# Patient Record
Sex: Female | Born: 2000 | Hispanic: Yes | Marital: Single | Smoking: Never smoker
Health system: Southern US, Community
[De-identification: ages and names within clinical notes are randomized; demographics above are authoritative.]

## PROBLEM LIST (undated history)

## (undated) DIAGNOSIS — Z789 Other specified health status: Secondary | ICD-10-CM

## (undated) HISTORY — PX: NO PAST SURGERIES: SHX2092

---
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
## 2019-03-13 ENCOUNTER — Other Ambulatory Visit: Payer: Self-pay

## 2019-03-13 DIAGNOSIS — Z3401 Encounter for supervision of normal first pregnancy, first trimester: Secondary | ICD-10-CM

## 2019-03-13 LAB — POCT URINALYSIS DIP (MANUAL ENTRY)
Bilirubin, UA: NEGATIVE
Blood, UA: NEGATIVE
Glucose, UA: NEGATIVE mg/dL
Ketones, POC UA: NEGATIVE mg/dL
Leukocytes, UA: NEGATIVE
Nitrite, UA: NEGATIVE
Protein Ur, POC: NEGATIVE mg/dL
Spec Grav, UA: 1.01 (ref 1.010–1.025)
Urobilinogen, UA: 0.2 E.U./dL
pH, UA: 7 (ref 5.0–8.0)

## 2019-03-15 LAB — OBSTETRIC PANEL, INCLUDING HIV
Antibody Screen: NEGATIVE
Basophils Absolute: 0 10*3/uL (ref 0.0–0.2)
Basos: 0 %
EOS (ABSOLUTE): 0.2 10*3/uL (ref 0.0–0.4)
Eos: 2 %
HIV Screen 4th Generation wRfx: NONREACTIVE
Hematocrit: 37.5 % (ref 34.0–46.6)
Hemoglobin: 13.1 g/dL (ref 11.1–15.9)
Hepatitis B Surface Ag: NEGATIVE
Immature Grans (Abs): 0.1 10*3/uL (ref 0.0–0.1)
Immature Granulocytes: 1 %
Lymphocytes Absolute: 1.7 10*3/uL (ref 0.7–3.1)
Lymphs: 19 %
MCH: 31.3 pg (ref 26.6–33.0)
MCHC: 34.9 g/dL (ref 31.5–35.7)
MCV: 90 fL (ref 79–97)
Monocytes Absolute: 0.7 10*3/uL (ref 0.1–0.9)
Monocytes: 8 %
Neutrophils Absolute: 6.5 10*3/uL (ref 1.4–7.0)
Neutrophils: 70 %
Platelets: 235 10*3/uL (ref 150–450)
RBC: 4.18 x10E6/uL (ref 3.77–5.28)
RDW: 12.8 % (ref 11.7–15.4)
RPR Ser Ql: NONREACTIVE
Rh Factor: POSITIVE
Rubella Antibodies, IGG: 3.89 index (ref >0.99)
WBC: 9.1 10*3/uL (ref 3.4–10.8)

## 2019-03-15 LAB — HGB FRAC. W/SOLUBILITY
Hgb A2 Quant: 2.1 % (ref 1.8–3.2)
Hgb A: 97.9 % (ref 96.4–98.8)
Hgb C: 0 %
Hgb F Quant: 0 % (ref 0.0–2.0)
Hgb S: 0 %
Hgb Solubility: NEGATIVE
Hgb Variant: 0 %

## 2019-03-15 LAB — URINE CULTURE, OB REFLEX

## 2019-03-15 LAB — CULTURE, OB URINE

## 2019-03-20 ENCOUNTER — Telehealth: Payer: Self-pay | Admitting: *Deleted

## 2019-03-20 ENCOUNTER — Ambulatory Visit (INDEPENDENT_AMBULATORY_CARE_PROVIDER_SITE_OTHER): Payer: Self-pay | Admitting: Family Medicine

## 2019-03-20 ENCOUNTER — Other Ambulatory Visit: Payer: Self-pay

## 2019-03-20 ENCOUNTER — Encounter: Payer: Self-pay | Admitting: Family Medicine

## 2019-03-20 ENCOUNTER — Other Ambulatory Visit (HOSPITAL_COMMUNITY)
Admission: RE | Admit: 2019-03-20 | Discharge: 2019-03-20 | Disposition: A | Discharge status: Home / Self Care | Payer: Self-pay | Source: Ambulatory Visit | Attending: Family Medicine | Admitting: Family Medicine

## 2019-03-20 VITALS — BP 106/70 | HR 71 | Temp 97.6°F | Wt 150.0 lb

## 2019-03-20 DIAGNOSIS — Z3401 Encounter for supervision of normal first pregnancy, first trimester: Secondary | ICD-10-CM | POA: Insufficient documentation

## 2019-03-20 MED ORDER — PNV PRENATAL PLUS MULTIVIT+DHA 27-1 & 312 MG PO MISC: 1.0000 | Freq: Every day | ORAL | 0 refills | Status: DC

## 2019-03-20 NOTE — Telephone Encounter (Signed)
Prenatal vit RX is not available. Can we change to prenatal pixie.  The difference is that DHA=200mg  and Iron=10mg .  Will forward to MD.  Jone Baseman, CMA

## 2019-03-20 NOTE — Patient Instructions (Signed)
First Trimester of Pregnancy  The first trimester of pregnancy is from week 1 until the end of week 13 (months 1 through 3). A week after a sperm fertilizes an egg, the egg will implant on the wall of the uterus. This embryo will begin to develop into a baby. Genes from you and your partner will form the baby. The female genes will determine whether the baby will be a boy or a girl. At 6-8 weeks, the eyes and face will be formed, and the heartbeat can be seen on ultrasound. At the end of 12 weeks, all the baby's organs will be formed.  Now that you are pregnant, you will want to do everything you can to have a healthy baby. Two of the most important things are to get good prenatal care and to follow your health care provider's instructions. Prenatal care is all the medical care you receive before the baby's birth. This care will help prevent, find, and treat any problems during the pregnancy and childbirth.  Body changes during your first trimester  Your body goes through many changes during pregnancy. The changes vary from woman to woman.   You may gain or lose a couple of pounds at first.   You may feel sick to your stomach (nauseous) and you may throw up (vomit). If the vomiting is uncontrollable, call your health care provider.   You may tire easily.   You may develop headaches that can be relieved by medicines. All medicines should be approved by your health care provider.   You may urinate more often. Painful urination may mean you have a bladder infection.   You may develop heartburn as a result of your pregnancy.   You may develop constipation because certain hormones are causing the muscles that push stool through your intestines to slow down.   You may develop hemorrhoids or swollen veins (varicose veins).   Your breasts may begin to grow larger and become tender. Your nipples may stick out more, and the tissue that surrounds them (areola) may become darker.   Your gums may bleed and may be  sensitive to brushing and flossing.   Dark spots or blotches (chloasma, mask of pregnancy) may develop on your face. This will likely fade after the baby is born.   Your menstrual periods will stop.   You may have a loss of appetite.   You may develop cravings for certain kinds of food.   You may have changes in your emotions from day to day, such as being excited to be pregnant or being concerned that something may go wrong with the pregnancy and baby.   You may have more vivid and strange dreams.   You may have changes in your hair. These can include thickening of your hair, rapid growth, and changes in texture. Some women also have hair loss during or after pregnancy, or hair that feels dry or thin. Your hair will most likely return to normal after your baby is born.  What to expect at prenatal visits  During a routine prenatal visit:   You will be weighed to make sure you and the baby are growing normally.   Your blood pressure will be taken.   Your abdomen will be measured to track your baby's growth.   The fetal heartbeat will be listened to between weeks 10 and 14 of your pregnancy.   Test results from any previous visits will be discussed.  Your health care provider may ask you:     How you are feeling.   If you are feeling the baby move.   If you have had any abnormal symptoms, such as leaking fluid, bleeding, severe headaches, or abdominal cramping.   If you are using any tobacco products, including cigarettes, chewing tobacco, and electronic cigarettes.   If you have any questions.  Other tests that may be performed during your first trimester include:   Blood tests to find your blood type and to check for the presence of any previous infections. The tests will also be used to check for low iron levels (anemia) and protein on red blood cells (Rh antibodies). Depending on your risk factors, or if you previously had diabetes during pregnancy, you may have tests to check for high blood sugar  that affects pregnant women (gestational diabetes).   Urine tests to check for infections, diabetes, or protein in the urine.   An ultrasound to confirm the proper growth and development of the baby.   Fetal screens for spinal cord problems (spina bifida) and Down syndrome.   HIV (human immunodeficiency virus) testing. Routine prenatal testing includes screening for HIV, unless you choose not to have this test.   You may need other tests to make sure you and the baby are doing well.  Follow these instructions at home:  Medicines   Follow your health care provider's instructions regarding medicine use. Specific medicines may be either safe or unsafe to take during pregnancy.   Take a prenatal vitamin that contains at least 600 micrograms (mcg) of folic acid.   If you develop constipation, try taking a stool softener if your health care provider approves.  Eating and drinking     Eat a balanced diet that includes fresh fruits and vegetables, whole grains, good sources of protein such as meat, eggs, or tofu, and low-fat dairy. Your health care provider will help you determine the amount of weight gain that is right for you.   Avoid raw meat and uncooked cheese. These carry germs that can cause birth defects in the baby.   Eating four or five small meals rather than three large meals a day may help relieve nausea and vomiting. If you start to feel nauseous, eating a few soda crackers can be helpful. Drinking liquids between meals, instead of during meals, also seems to help ease nausea and vomiting.   Limit foods that are high in fat and processed sugars, such as fried and sweet foods.   To prevent constipation:  ? Eat foods that are high in fiber, such as fresh fruits and vegetables, whole grains, and beans.  ? Drink enough fluid to keep your urine clear or pale yellow.  Activity   Exercise only as directed by your health care provider. Most women can continue their usual exercise routine during  pregnancy. Try to exercise for 30 minutes at least 5 days a week. Exercising will help you:  ? Control your weight.  ? Stay in shape.  ? Be prepared for labor and delivery.   Experiencing pain or cramping in the lower abdomen or lower back is a good sign that you should stop exercising. Check with your health care provider before continuing with normal exercises.   Try to avoid standing for long periods of time. Move your legs often if you must stand in one place for a long time.   Avoid heavy lifting.   Wear low-heeled shoes and practice good posture.   You may continue to have sex unless your health care   provider tells you not to.  Relieving pain and discomfort   Wear a good support bra to relieve breast tenderness.   Take warm sitz baths to soothe any pain or discomfort caused by hemorrhoids. Use hemorrhoid cream if your health care provider approves.   Rest with your legs elevated if you have leg cramps or low back pain.   If you develop varicose veins in your legs, wear support hose. Elevate your feet for 15 minutes, 3-4 times a day. Limit salt in your diet.  Prenatal care   Schedule your prenatal visits by the twelfth week of pregnancy. They are usually scheduled monthly at first, then more often in the last 2 months before delivery.   Write down your questions. Take them to your prenatal visits.   Keep all your prenatal visits as told by your health care provider. This is important.  Safety   Wear your seat belt at all times when driving.   Make a list of emergency phone numbers, including numbers for family, friends, the hospital, and police and fire departments.  General instructions   Ask your health care provider for a referral to a local prenatal education class. Begin classes no later than the beginning of month 6 of your pregnancy.   Ask for help if you have counseling or nutritional needs during pregnancy. Your health care provider can offer advice or refer you to specialists for help  with various needs.   Do not use hot tubs, steam rooms, or saunas.   Do not douche or use tampons or scented sanitary pads.   Do not cross your legs for long periods of time.   Avoid cat litter boxes and soil used by cats. These carry germs that can cause birth defects in the baby and possibly loss of the fetus by miscarriage or stillbirth.   Avoid all smoking, herbs, alcohol, and medicines not prescribed by your health care provider. Chemicals in these products affect the formation and growth of the baby.   Do not use any products that contain nicotine or tobacco, such as cigarettes and e-cigarettes. If you need help quitting, ask your health care provider. You may receive counseling support and other resources to help you quit.   Schedule a dentist appointment. At home, brush your teeth with a soft toothbrush and be gentle when you floss.  Contact a health care provider if:   You have dizziness.   You have mild pelvic cramps, pelvic pressure, or nagging pain in the abdominal area.   You have persistent nausea, vomiting, or diarrhea.   You have a bad smelling vaginal discharge.   You have pain when you urinate.   You notice increased swelling in your face, hands, legs, or ankles.   You are exposed to fifth disease or chickenpox.   You are exposed to German measles (rubella) and have never had it.  Get help right away if:   You have a fever.   You are leaking fluid from your vagina.   You have spotting or bleeding from your vagina.   You have severe abdominal cramping or pain.   You have rapid weight gain or loss.   You vomit blood or material that looks like coffee grounds.   You develop a severe headache.   You have shortness of breath.   You have any kind of trauma, such as from a fall or a car accident.  Summary   The first trimester of pregnancy is from week 1 until   the end of week 13 (months 1 through 3).   Your body goes through many changes during pregnancy. The changes vary from  woman to woman.   You will have routine prenatal visits. During those visits, your health care provider will examine you, discuss any test results you may have, and talk with you about how you are feeling.  This information is not intended to replace advice given to you by your health care provider. Make sure you discuss any questions you have with your health care provider.  Document Released: 10/25/2001 Document Revised: 10/12/2016 Document Reviewed: 10/12/2016  Elsevier Interactive Patient Education  2019 [employer]

## 2019-03-20 NOTE — Progress Notes (Signed)
  Subjective:    Donna [name] is a G2P1001  being seen today for her first obstetrical visit.  Her obstetrical history is significant for none. Patient does intend to breast feed. Pregnancy history fully reviewed.  Patient's first child has some ID w/ concern for autism. Pt inquiring about testing for autism for current pregnancy.   Patient wants first trimester genetic screening.   PHQ9 Neg.   Negative IPV screen.   BMI is 27 and < 30. Hispanic. Does not meet criteria for early GTT.   PN labs wnl.   Pt need GC/Chlamydia testing  Patient reports no bleeding, no contractions, no cramping and no leaking.  Vitals:   03/20/19 1100  BP: 106/70  Pulse: 71  Temp: 97.6 F (36.4 C)  Weight: 150 lb (68 kg)    HISTORY: OB History  Gravida Para Term Preterm AB Living  2 1 1     1   SAB TAB Ectopic Multiple Live Births        0 1    # Outcome Date GA Lbr Len/2nd Weight Sex Delivery Anes PTL Lv  2 Current           1 Term 04/06/16 [redacted]w[redacted]d 13:30 / 01:31 7 lb 4.4 oz (3.3 kg) F Vag-Spont EPI  LIV   Past Medical History:  Diagnosis Date  . Medical history non-contributory    Past Surgical History:  Procedure Laterality Date  . NO PAST SURGERIES     No family history on file.   Exam   Gen: NAD, resting comfortably CV: RRR with no murmurs appreciated Pulm: NWOB, CTAB with no crackles, wheezes, or rhonchi GI: Normal bowel sounds present. Soft, Nontender, Nondistended. MSK: no edema, cyanosis, or clubbing noted Skin: warm, dry Neuro: grossly normal, moves all extremities Psych: Normal affect and thought content Today's Vitals   03/20/19 1100  BP: 106/70  Pulse: 71  Temp: 97.6 F (36.4 C)  Weight: 150 lb (68 kg)   There is no height or weight on file to calculate BMI.    Assessment:    Pregnancy: G1P1001 Patient Active Problem List   Diagnosis Date Noted  . NSVD (normal spontaneous vaginal delivery) 04/06/2016  . Active labor at term 04/05/2016         Plan:     Initial labs drawn. Prenatal vitamins. Problem list reviewed and updated. Genetic Screening discussed Quad Screen: ordered.  Ultrasound discussed; fetal survey: ordered.  Follow up in 3 weeks. 80% of 60 min visit spent on counseling and coordination of care.     Donna [name] 03/20/2019

## 2019-03-21 ENCOUNTER — Encounter: Payer: Self-pay | Admitting: Family Medicine

## 2019-03-21 LAB — CERVICOVAGINAL ANCILLARY ONLY
Chlamydia: NEGATIVE
Neisseria Gonorrhea: NEGATIVE

## 2019-03-22 ENCOUNTER — Encounter: Payer: Self-pay | Admitting: Family Medicine

## 2019-03-27 ENCOUNTER — Encounter: Payer: Self-pay | Admitting: Licensed Clinical Social Worker

## 2019-03-27 NOTE — Progress Notes (Signed)
Social work referral placed in error by provider.  Patient is with [facility] [facility] [employer].   No social work services needs at this time.   Sammuel Hines, LCSW [facility]   [phone] 1:59 PM

## 2019-03-28 ENCOUNTER — Other Ambulatory Visit: Payer: Self-pay | Admitting: Family Medicine

## 2019-03-28 DIAGNOSIS — Z3401 Encounter for supervision of normal first pregnancy, first trimester: Secondary | ICD-10-CM

## 2019-04-01 ENCOUNTER — Telehealth: Payer: Self-pay

## 2019-04-01 ENCOUNTER — Encounter (HOSPITAL_COMMUNITY): Payer: Self-pay

## 2019-04-01 ENCOUNTER — Other Ambulatory Visit: Payer: Self-pay

## 2019-04-01 ENCOUNTER — Ambulatory Visit (HOSPITAL_COMMUNITY)
Admission: RE | Admit: 2019-04-01 | Discharge: 2019-04-01 | Disposition: A | Discharge status: Home / Self Care | Payer: Self-pay | Source: Ambulatory Visit | Attending: Family Medicine | Admitting: Family Medicine

## 2019-04-01 DIAGNOSIS — Z3401 Encounter for supervision of normal first pregnancy, first trimester: Secondary | ICD-10-CM

## 2019-04-01 NOTE — Telephone Encounter (Signed)
Eber Jones, with MFM, called nurse line stating the patient came in this morning for her dating [place]. Eber Jones stated the patient informed them she just had an US done with the health department, as a part of the adopt a mom program. Eber Jones called to let us know that she was going to let the patient go with being scanned, as patient just had [place], and it would cost her 500 today to have another one done. You can call Eber Jones with any questions regarding future [place].   Eber Jones [phone]

## 2019-04-03 ENCOUNTER — Encounter: Payer: Self-pay | Admitting: Family Medicine

## 2019-04-03 DIAGNOSIS — Z3490 Encounter for supervision of normal pregnancy, unspecified, unspecified trimester: Secondary | ICD-10-CM | POA: Insufficient documentation

## 2019-04-12 ENCOUNTER — Other Ambulatory Visit: Payer: Self-pay

## 2019-04-12 ENCOUNTER — Ambulatory Visit (INDEPENDENT_AMBULATORY_CARE_PROVIDER_SITE_OTHER): Payer: Self-pay | Admitting: Family Medicine

## 2019-04-12 DIAGNOSIS — Z3401 Encounter for supervision of normal first pregnancy, first trimester: Secondary | ICD-10-CM

## 2019-04-12 MED ORDER — PNV PRENATAL PLUS MULTIVIT+DHA 27-1 & 312 MG PO MISC: 1.0000 | Freq: Every day | ORAL | 3 refills | Status: DC

## 2019-04-12 NOTE — Progress Notes (Signed)
Donna [name] is a 18 y.o. G2P1001 at 108w0d here for routine follow up.  She reports nausea, no bleeding, no contractions, no cramping and no leaking. See flow sheet for details.  She recently had a dating ultrasound on 5/11 that showed an estimated due date of 09/26/2019.  This corresponds with her LMP indicating an estimated due date of 09/27/2019.  She would like to breast-feed and get the copper IUD after pregnancy.  If the baby is a boy, she is interested in a circumcision.  Height: 58.5" Pregravid BMI: 29.3 FHT: 142 bpm Fundal height: 16 cm GU: Normal vulva, vagina, and cervix.  No abnormal discharge.  Uterus is appropriate size for dates.  A/P: Pregnancy at [redacted]w[redacted]d.  Doing well.   Pregnancy issues include young age, no insurance.  Part of adopt-a-mom program. Anatomy ultrasound ordered to be scheduled at 18-19 weeks.  Patient will get this done at the health department. Genetic/infection screening was performed today and was updated in patient's chart. Quad screen was performed today. GC/Chlamydia was performed at her last visit and was negative.  Patient was informed today. Bleeding and pain precautions reviewed. [facility]

## 2019-04-12 NOTE — Patient Instructions (Addendum)
It was nice to meet you today, Donna [name]!  Today, we are scheduling you for your anatomy ultrasound, which will be done in about 2 weeks.  I also sent in  Second Trimester of Pregnancy The second trimester is from week 14 through week 27 (months 4 through 6). The second trimester is often a time when you feel your best. Your body has adjusted to being pregnant, and you begin to feel better physically. Usually, morning sickness has lessened or quit completely, you may have more energy, and you may have an increase in appetite. The second trimester is also a time when the fetus is growing rapidly. At the end of the sixth month, the fetus is about 9 inches long and weighs about 1 pounds. You will likely begin to feel the baby move (quickening) between 16 and 20 weeks of pregnancy. Body changes during your second trimester Your body continues to go through many changes during your second trimester. The changes vary from woman to woman.  Your weight will continue to increase. You will notice your lower abdomen bulging out.  You may begin to get stretch marks on your hips, abdomen, and breasts.  You may develop headaches that can be relieved by medicines. The medicines should be approved by your health care provider.  You may urinate more often because the fetus is pressing on your bladder.  You may develop or continue to have heartburn as a result of your pregnancy.  You may develop constipation because certain hormones are causing the muscles that push waste through your intestines to slow down.  You may develop hemorrhoids or swollen, bulging veins (varicose veins).  You may have back pain. This is caused by: ? Weight gain. ? Pregnancy hormones that are relaxing the joints in your pelvis. ? A shift in weight and the muscles that support your balance.  Your breasts will continue to grow and they will continue to become tender.  Your gums may bleed and may be sensitive to brushing  and flossing.  Dark spots or blotches (chloasma, mask of pregnancy) may develop on your face. This will likely fade after the baby is born.  A dark line from your belly button to the pubic area (linea nigra) may appear. This will likely fade after the baby is born.  You may have changes in your hair. These can include thickening of your hair, rapid growth, and changes in texture. Some women also have hair loss during or after pregnancy, or hair that feels dry or thin. Your hair will most likely return to normal after your baby is born. What to expect at prenatal visits During a routine prenatal visit:  You will be weighed to make sure you and the fetus are growing normally.  Your blood pressure will be taken.  Your abdomen will be measured to track your baby's growth.  The fetal heartbeat will be listened to.  Any test results from the previous visit will be discussed. Your health care provider may ask you:  How you are feeling.  If you are feeling the baby move.  If you have had any abnormal symptoms, such as leaking fluid, bleeding, severe headaches, or abdominal cramping.  If you are using any tobacco products, including cigarettes, chewing tobacco, and electronic cigarettes.  If you have any questions. Other tests that may be performed during your second trimester include:  Blood tests that check for: ? Low iron levels (anemia). ? High blood sugar that affects pregnant women (gestational diabetes)  between 24 and 28 weeks. ? Rh antibodies. This is to check for a protein on red blood cells (Rh factor).  Urine tests to check for infections, diabetes, or protein in the urine.  An ultrasound to confirm the proper growth and development of the baby.  An amniocentesis to check for possible genetic problems.  Fetal screens for spina bifida and Down syndrome.  HIV (human immunodeficiency virus) testing. Routine prenatal testing includes screening for HIV, unless you choose not  to have this test. Follow these instructions at home: Medicines  Follow your health care provider's instructions regarding medicine use. Specific medicines may be either safe or unsafe to take during pregnancy.  Take a prenatal vitamin that contains at least 600 micrograms (mcg) of folic acid.  If you develop constipation, try taking a stool softener if your health care provider approves. Eating and drinking   Eat a balanced diet that includes fresh fruits and vegetables, whole grains, good sources of protein such as meat, eggs, or tofu, and low-fat dairy. Your health care provider will help you determine the amount of weight gain that is right for you.  Avoid raw meat and uncooked cheese. These carry germs that can cause birth defects in the baby.  If you have low calcium intake from food, talk to your health care provider about whether you should take a daily calcium supplement.  Limit foods that are high in fat and processed sugars, such as fried and sweet foods.  To prevent constipation: ? Drink enough fluid to keep your urine clear or pale yellow. ? Eat foods that are high in fiber, such as fresh fruits and vegetables, whole grains, and beans. Activity  Exercise only as directed by your health care provider. Most women can continue their usual exercise routine during pregnancy. Try to exercise for 30 minutes at least 5 days a week. Stop exercising if you experience uterine contractions.  Avoid heavy lifting, wear low heel shoes, and practice good posture.  A sexual relationship may be continued unless your health care provider directs you otherwise. Relieving pain and discomfort  Wear a good support bra to prevent discomfort from breast tenderness.  Take warm sitz baths to soothe any pain or discomfort caused by hemorrhoids. Use hemorrhoid cream if your health care provider approves.  Rest with your legs elevated if you have leg cramps or low back pain.  If you develop  varicose veins, wear support hose. Elevate your feet for 15 minutes, 3-4 times a day. Limit salt in your diet. Prenatal Care  Write down your questions. Take them to your prenatal visits.  Keep all your prenatal visits as told by your health care provider. This is important. Safety  Wear your seat belt at all times when driving.  Make a list of emergency phone numbers, including numbers for family, friends, the hospital, and police and fire departments. General instructions  Ask your health care provider for a referral to a local prenatal education class. Begin classes no later than the beginning of month 6 of your pregnancy.  Ask for help if you have counseling or nutritional needs during pregnancy. Your health care provider can offer advice or refer you to specialists for help with various needs.  Do not use hot tubs, steam rooms, or saunas.  Do not douche or use tampons or scented sanitary pads.  Do not cross your legs for long periods of time.  Avoid cat litter boxes and soil used by cats. These carry germs that can  cause birth defects in the baby and possibly loss of the fetus by miscarriage or stillbirth.  Avoid all smoking, herbs, alcohol, and unprescribed drugs. Chemicals in these products can affect the formation and growth of the baby.  Do not use any products that contain nicotine or tobacco, such as cigarettes and e-cigarettes. If you need help quitting, ask your health care provider.  Visit your dentist if you have not gone yet during your pregnancy. Use a soft toothbrush to brush your teeth and be gentle when you floss. Contact a health care provider if:  You have dizziness.  You have mild pelvic cramps, pelvic pressure, or nagging pain in the abdominal area.  You have persistent nausea, vomiting, or diarrhea.  You have a bad smelling vaginal discharge.  You have pain when you urinate. Get help right away if:  You have a fever.  You are leaking fluid from  your vagina.  You have spotting or bleeding from your vagina.  You have severe abdominal cramping or pain.  You have rapid weight gain or weight loss.  You have shortness of breath with chest pain.  You notice sudden or extreme swelling of your face, hands, ankles, feet, or legs.  You have not felt your baby move in over an hour.  You have severe headaches that do not go away when you take medicine.  You have vision changes. Summary  The second trimester is from week 14 through week 27 (months 4 through 6). It is also a time when the fetus is growing rapidly.  Your body goes through many changes during pregnancy. The changes vary from woman to woman.  Avoid all smoking, herbs, alcohol, and unprescribed drugs. These chemicals affect the formation and growth your baby.  Do not use any tobacco products, such as cigarettes, chewing tobacco, and e-cigarettes. If you need help quitting, ask your health care provider.  Contact your health care provider if you have any questions. Keep all prenatal visits as told by your health care provider. This is important. This information is not intended to replace advice given to you by your health care provider. Make sure you discuss any questions you have with your health care provider. Document Released: 10/25/2001 Document Revised: 12/06/2016 Document Reviewed: 12/06/2016 Elsevier Interactive Patient Education  2019 [employer]

## 2019-04-15 ENCOUNTER — Other Ambulatory Visit: Payer: Self-pay

## 2019-04-23 ENCOUNTER — Encounter: Payer: Self-pay | Admitting: Family Medicine

## 2019-04-23 ENCOUNTER — Telehealth: Payer: Self-pay

## 2019-04-23 NOTE — Telephone Encounter (Signed)
Pharmacist calls nurse line stating the recent prenatals that were sent in are not in their system. Pharmacy stated they are OTC now and can not be prescribed anymore. The pharmacist suggested to patient to purchase OTC, however patient wants a prescription. Pharmacy is requesting an alternative to be sent in. When I asked what he suggested, "there are 1 million to choose from." Please advise.

## 2019-04-29 ENCOUNTER — Other Ambulatory Visit: Payer: Self-pay | Admitting: Family Medicine

## 2019-04-29 MED ORDER — PRENATAL VITAMINS 28-0.8 MG PO TABS: 1.0000 | ORAL_TABLET | Freq: Every day | ORAL | 6 refills | Status: AC

## 2019-04-29 NOTE — Telephone Encounter (Signed)
I have sent another prescription for prenatal vitamins.  If these are not in the system, I will be happy to speak to a pharmacist or tech about what they suggest instead.

## 2019-05-01 ENCOUNTER — Encounter: Payer: Self-pay | Admitting: Family Medicine

## 2019-05-13 ENCOUNTER — Ambulatory Visit (INDEPENDENT_AMBULATORY_CARE_PROVIDER_SITE_OTHER): Payer: Self-pay | Admitting: Student in an Organized Health Care Education/Training Program

## 2019-05-13 ENCOUNTER — Other Ambulatory Visit: Payer: Self-pay

## 2019-05-13 VITALS — BP 96/60 | HR 88 | Temp 98.6°F | Wt 155.6 lb

## 2019-05-13 DIAGNOSIS — Z3482 Encounter for supervision of other normal pregnancy, second trimester: Secondary | ICD-10-CM

## 2019-05-13 NOTE — Patient Instructions (Signed)
Please follow up in 4 weeks in [facility].

## 2019-05-13 NOTE — Progress Notes (Signed)
Donna [name] is a 18 y.o. G2P1001 at [redacted]w[redacted]d here for routine follow up.  She reports +FM, no bleeding, no contractions, no LOF, no pain, no nausea. She reports +normal white vaginal discharge with no pruritis, dysuria, or pain.  See flow sheet for details.  A/P: Pregnancy at [redacted]w[redacted]d.  Doing well.   Pregnancy issues include young age, no insurance. Adopt a mom program.   1. Anatomy scan has been completed, not yet available in epic, will need to be reviewed at next visit.  2. Genetic/infection screening from last visit was reviewed. GC/chla negative. Quad screen negative.  3. Normal prenatal care Fetal heart tones difficult to find with doppler. U/S brought into the room. Heart beat clearly visualized. We were able to find FHT with doppler using U/S visualization. Preterm labor precautions reviewed. Follow up 4 weeks.

## 2019-05-15 ENCOUNTER — Other Ambulatory Visit: Payer: Self-pay | Admitting: Family Medicine

## 2019-05-15 DIAGNOSIS — O321XX0 Maternal care for breech presentation, not applicable or unspecified: Secondary | ICD-10-CM

## 2019-05-15 NOTE — Progress Notes (Signed)
Breech presentation on U/S. Referral to OB/GYN for further management.

## 2019-05-16 NOTE — Addendum Note (Signed)
Addended by: Leeanne Rio on: 05/16/2019 04:01 PM   Modules accepted: Orders

## 2019-05-16 NOTE — Progress Notes (Signed)
Reviewed chart as preceptor for recent prenatal visit. Referral to OBGYN for breech presentation at 13 weeks is not indicated. Canceled referral. Leeanne Rio, MD

## 2019-06-13 ENCOUNTER — Telehealth: Payer: Self-pay | Admitting: Family Medicine

## 2019-06-13 ENCOUNTER — Ambulatory Visit (INDEPENDENT_AMBULATORY_CARE_PROVIDER_SITE_OTHER): Payer: Self-pay | Admitting: Family Medicine

## 2019-06-13 ENCOUNTER — Other Ambulatory Visit: Payer: Self-pay

## 2019-06-13 VITALS — BP 102/62 | HR 98 | Temp 97.6°F | Wt 161.0 lb

## 2019-06-13 DIAGNOSIS — Z3482 Encounter for supervision of other normal pregnancy, second trimester: Secondary | ICD-10-CM

## 2019-06-13 LAB — POCT 1 HR PRENATAL GLUCOSE: Glucose 1 Hr Prenatal, POC: 116 mg/dL

## 2019-06-13 NOTE — Assessment & Plan Note (Signed)
Normal, low risk pregnancy - Will follow up with [facility] to obtain anatomy US - 1 hr glucola today at 116 - Will refer pt to Dr. Kennon Rounds to discuss tubal ligation vs postplacental IUD - 4 wk follow up at 28 weeks for labs

## 2019-06-13 NOTE — Telephone Encounter (Signed)
Called patient to discuss results of ultrasound. Low lying placenta was noted. Everything else was within normal limits. Patient appreciated discussion and acknowledged understanding.

## 2019-06-13 NOTE — Progress Notes (Addendum)
    Subjective:    Patient ID: Donna [name], female    DOB: Nov 23, 2000, 18 y.o.   MRN: [id number]   CC: second trimester of pregnancy screening  HPI: Donna [name] is a healthy 18 yo G2P1001 woman at [redacted]w[redacted]d presenting today for routine prenatal screening.  +FM, No VB, LOF, CTX.  Donna [name] asked about tubal ligation. She wanted to get an IUD after her last child, but she was told to wait 40 days and then have it inserted with her period, but her period did not return for over a year. Her older daughter is likely on the autism spectrum disorder and she wants to focus on caring for these two children only. Because of her age and she does not have insurance, other birth control methods were discussed, and Donna [name] is interested in a postplacental IUD as a back up plan.  Donna [name] plans to breast feed, she successfully bf her daughter for over a year. She is currently noticing small, clear leakage from her breasts. No redness, swelling, heat, fever, or chills.  She plans to have a vaginal delivery with an epidural, which was her first delivery course as well. No problems with previous birth.  Donna [name] had an anatomy [place] through [employer] on 04/29/2019.   Smoking status reviewed   ROS: pertinent noted in the HPI    Past medical history, surgical, family, and social history reviewed and updated in the EMR as appropriate.  Objective:  BP 102/62   Pulse 98   Temp 97.6 F (36.4 C)   Wt 161 lb (73 kg)   LMP 12/21/2018 (Exact Date)   Vitals and nursing note reviewed  General: NAD, pleasant, able to participate in exam Cardiac: RRR, S1 S2 present. normal heart sounds, no murmurs. Respiratory: CTAB, normal effort, No wheezes, rales or rhonchi Extremities: no edema or cyanosis. Skin: warm and dry, no rashes noted Neuro: alert, no obvious focal deficits Psych: Normal affect and mood  Doppler tones: 130 Fundal height: 25 cm  Assessment & Plan:   Donna [name] is a healthy 18 yo  G2P1001 woman at [redacted]w[redacted]d.  Supervision of normal pregnancy Normal, low risk pregnancy - Will follow up with [facility] to obtain anatomy US - 1 hr glucola today at 116 - Will refer Donna [name] to Dr. Kennon Rounds to discuss tubal ligation vs postplacental IUD - 4 wk follow up at 28 weeks for labs  Gladys Damme, MD [place] PGY-1

## 2019-06-13 NOTE — Patient Instructions (Signed)
It was nice to meet you today!  If you have any cramping/contractions, vaginal bleeding, fluid leaking, or are worried that baby is not moving normally, go immediately to the Maternity Admissions Unit to be evaluated.   Next visit with Dr. Kennon Rounds in 4 weeks to talk about possible getting tubes tied  Be well, Dr. Ardelia Mems    Second Trimester of Pregnancy  The second trimester is from week 14 through week 27 (month 4 through 6). This is often the time in pregnancy that you feel your best. Often times, morning sickness has lessened or quit. You may have more energy, and you may get hungry more often. Your unborn baby is growing rapidly. At the end of the sixth month, he or she is about 9 inches long and weighs about 1 pounds. You will likely feel the baby move between 18 and 20 weeks of pregnancy. Follow these instructions at home: Medicines  Take over-the-counter and prescription medicines only as told by your doctor. Some medicines are safe and some medicines are not safe during pregnancy.  Take a prenatal vitamin that contains at least 600 micrograms (mcg) of folic acid.  If you have trouble pooping (constipation), take medicine that will make your stool soft (stool softener) if your doctor approves. Eating and drinking   Eat regular, healthy meals.  Avoid raw meat and uncooked cheese.  If you get low calcium from the food you eat, talk to your doctor about taking a daily calcium supplement.  Avoid foods that are high in fat and sugars, such as fried and sweet foods.  If you feel sick to your stomach (nauseous) or throw up (vomit): ? Eat 4 or 5 small meals a day instead of 3 large meals. ? Try eating a few soda crackers. ? Drink liquids between meals instead of during meals.  To prevent constipation: ? Eat foods that are high in fiber, like fresh fruits and vegetables, whole grains, and beans. ? Drink enough fluids to keep your pee (urine) clear or pale yellow. Activity   Exercise only as told by your doctor. Stop exercising if you start to have cramps.  Do not exercise if it is too hot, too humid, or if you are in a place of great height (high altitude).  Avoid heavy lifting.  Wear low-heeled shoes. Sit and stand up straight.  You can continue to have sex unless your doctor tells you not to. Relieving pain and discomfort  Wear a good support bra if your breasts are tender.  Take warm water baths (sitz baths) to soothe pain or discomfort caused by hemorrhoids. Use hemorrhoid cream if your doctor approves.  Rest with your legs raised if you have leg cramps or low back pain.  If you develop puffy, bulging veins (varicose veins) in your legs: ? Wear support hose or compression stockings as told by your doctor. ? Raise (elevate) your feet for 15 minutes, 3-4 times a day. ? Limit salt in your food. Prenatal care  Write down your questions. Take them to your prenatal visits.  Keep all your prenatal visits as told by your doctor. This is important. Safety  Wear your seat belt when driving.  Make a list of emergency phone numbers, including numbers for family, friends, the hospital, and police and fire departments. General instructions  Ask your doctor about the right foods to eat or for help finding a counselor, if you need these services.  Ask your doctor about local prenatal classes. Begin classes before month  6 of your pregnancy.  Do not use hot tubs, steam rooms, or saunas.  Do not douche or use tampons or scented sanitary pads.  Do not cross your legs for long periods of time.  Visit your dentist if you have not done so. Use a soft toothbrush to brush your teeth. Floss gently.  Avoid all smoking, herbs, and alcohol. Avoid drugs that are not approved by your doctor.  Do not use any products that contain nicotine or tobacco, such as cigarettes and e-cigarettes. If you need help quitting, ask your doctor.  Avoid cat litter boxes and soil  used by cats. These carry germs that can cause birth defects in the baby and can cause a loss of your baby (miscarriage) or stillbirth. Contact a doctor if:  You have mild cramps or pressure in your lower belly.  You have pain when you pee (urinate).  You have bad smelling fluid coming from your vagina.  You continue to feel sick to your stomach (nauseous), throw up (vomit), or have watery poop (diarrhea).  You have a nagging pain in your belly area.  You feel dizzy. Get help right away if:  You have a fever.  You are leaking fluid from your vagina.  You have spotting or bleeding from your vagina.  You have severe belly cramping or pain.  You lose or gain weight rapidly.  You have trouble catching your breath and have chest pain.  You notice sudden or extreme puffiness (swelling) of your face, hands, ankles, feet, or legs.  You have not felt the baby move in over an hour.  You have severe headaches that do not go away when you take medicine.  You have trouble seeing. Summary  The second trimester is from week 14 through week 27 (months 4 through 6). This is often the time in pregnancy that you feel your best.  To take care of yourself and your unborn baby, you will need to eat healthy meals, take medicines only if your doctor tells you to do so, and do activities that are safe for you and your baby.  Call your doctor if you get sick or if you notice anything unusual about your pregnancy. Also, call your doctor if you need help with the right food to eat, or if you want to know what activities are safe for you. This information is not intended to replace advice given to you by your health care provider. Make sure you discuss any questions you have with your health care provider. Document Released: 01/25/2010 Document Revised: 02/22/2019 Document Reviewed: 12/06/2016 Elsevier Patient Education  2020 [employer]

## 2019-06-14 NOTE — Addendum Note (Signed)
Addended by: Leeanne Rio on: 06/14/2019 04:08 PM   Modules accepted: Orders

## 2019-07-09 ENCOUNTER — Other Ambulatory Visit: Payer: Self-pay

## 2019-07-09 ENCOUNTER — Ambulatory Visit (INDEPENDENT_AMBULATORY_CARE_PROVIDER_SITE_OTHER): Payer: Self-pay | Admitting: Family Medicine

## 2019-07-09 VITALS — BP 98/54 | HR 83 | Wt 166.0 lb

## 2019-07-09 DIAGNOSIS — Z3482 Encounter for supervision of other normal pregnancy, second trimester: Secondary | ICD-10-CM

## 2019-07-09 DIAGNOSIS — O4443 Low lying placenta NOS or without hemorrhage, third trimester: Secondary | ICD-10-CM

## 2019-07-09 NOTE — Progress Notes (Addendum)
   PRENATAL VISIT NOTE  Subjective:  Donna [name] is a 18 y.o. G2P1001 at [redacted]w[redacted]d being seen today for ongoing prenatal care.  She is currently monitored for the following issues for this low-risk pregnancy and has Supervision of normal pregnancy on their problem list.  Patient reports no complaints.  Contractions: Not present.  .  Movement: Present. Denies leaking of fluid.   The following portions of the patient's history were reviewed and updated as appropriate: allergies, current medications, past family history, past medical history, past social history, past surgical history and problem list.   Objective:   Vitals:   07/09/19 1356  BP: (!) 98/54  Pulse: 83  Weight: 166 lb (75.3 kg)    Fetal Status: Fetal Heart Rate (bpm): 125 Fundal Height: 28 cm Movement: Present     General:  Alert, oriented and cooperative. Patient is in no acute distress.  Skin: Skin is warm and dry. No rash noted.   Cardiovascular: Normal heart rate noted  Respiratory: Normal respiratory effort, no problems with respiration noted  Abdomen: Soft, gravid, appropriate for gestational age.  Pain/Pressure: Absent     Pelvic: Cervical exam deferred        Extremities: Normal range of motion.     Mental Status: Normal mood and affect. Normal behavior. Normal judgment and thought content.   Assessment and Plan:  Pregnancy: G2P1001 at [redacted]w[redacted]d 1. Encounter for supervision of other normal pregnancy in second trimester 28 wk labs TDaP and flu at [facility] Discussed contraception would be a candidate for immediate postplacental Liletta IUD. Desires paragard, but not on formulary right now for placement. - CBC - HIV antibody (with reflex) - RPR  2. Low lying placenta nos or without hemorrhage, third trimester 7 mm from os at anatomy us-->f/u at > 32 wks, if > 2 cm from int os no contraindication to vaginal delivery - US OB Limited; Future  Preterm labor symptoms and general obstetric precautions  including but not limited to vaginal bleeding, contractions, leaking of fluid and fetal movement were reviewed in detail with the patient. Please refer to After Visit Summary for other counseling recommendations.   Return in 2 weeks (on 07/23/2019).  No future appointments.  Donnamae Jude, MD

## 2019-07-09 NOTE — Patient Instructions (Signed)

## 2019-07-09 NOTE — Addendum Note (Signed)
Addended by: Donnamae Jude on: 07/09/2019 04:43 PM   Modules accepted: Orders

## 2019-07-10 LAB — CBC
Hematocrit: 36 % (ref 34.0–46.6)
Hemoglobin: 12.1 g/dL (ref 11.1–15.9)
MCH: 31 pg (ref 26.6–33.0)
MCHC: 33.6 g/dL (ref 31.5–35.7)
MCV: 92 fL (ref 79–97)
Platelets: 181 10*3/uL (ref 150–450)
RBC: 3.9 x10E6/uL (ref 3.77–5.28)
RDW: 12.2 % (ref 11.7–15.4)
WBC: 10.1 10*3/uL (ref 3.4–10.8)

## 2019-07-10 LAB — RPR: RPR Ser Ql: NONREACTIVE

## 2019-07-10 LAB — HIV ANTIBODY (ROUTINE TESTING W REFLEX): HIV Screen 4th Generation wRfx: NONREACTIVE

## 2019-07-23 ENCOUNTER — Encounter: Payer: Self-pay | Admitting: Family Medicine

## 2019-08-20 ENCOUNTER — Other Ambulatory Visit: Payer: Self-pay

## 2019-08-20 DIAGNOSIS — Z20822 Contact with and (suspected) exposure to covid-19: Secondary | ICD-10-CM

## 2019-08-22 LAB — NOVEL CORONAVIRUS, NAA: SARS-CoV-2, NAA: NOT DETECTED

## 2019-09-01 NOTE — Progress Notes (Signed)
Donna [name] is a 18 y.o. G2P1001 at [redacted]w[redacted]d here for routine follow up.  She reports no bleeding, LOF. Feeling baby move. Some pelvic pain with walking. See flow sheet for details.  GYN:  External genitalia within normal limits.  Vaginal mucosa pink, moist, normal rugae.  Nonfriable cervix without lesions, closed. Minimal white discharge noted on speculum exam. No bleeding. Bimanual exam revealed normal, nongravid uterus.  No cervical motion tenderness. No adnexal masses bilaterally.    A/P: Pregnancy at [redacted]w[redacted]d.  Doing well.   Pregnancy issues include: - Adopt a mom program.  Infant feeding choice: breast Contraception choice: postplacental Liletta IUD Infant circumcision desired: yes  Tdap was not given today. Will provide prescription for patient to obtain at health department given Adopt-a-mom program. GBS and gc/chlamydia testing was performed today. No need for HSV prophylaxis.  Preterm labor and fetal movement precautions reviewed. Safe sleep discussed. Follow up 2 week(s) for [facility].

## 2019-09-02 ENCOUNTER — Ambulatory Visit (INDEPENDENT_AMBULATORY_CARE_PROVIDER_SITE_OTHER): Payer: Self-pay | Admitting: Family Medicine

## 2019-09-02 ENCOUNTER — Encounter: Payer: Self-pay | Admitting: Family Medicine

## 2019-09-02 ENCOUNTER — Other Ambulatory Visit (HOSPITAL_COMMUNITY)
Admission: RE | Admit: 2019-09-02 | Discharge: 2019-09-02 | Disposition: A | Discharge status: Home / Self Care | Payer: Self-pay | Source: Ambulatory Visit | Attending: Family Medicine | Admitting: Family Medicine

## 2019-09-02 ENCOUNTER — Other Ambulatory Visit: Payer: Self-pay

## 2019-09-02 VITALS — BP 100/58 | HR 99 | Wt 175.2 lb

## 2019-09-02 DIAGNOSIS — Z3A36 36 weeks gestation of pregnancy: Secondary | ICD-10-CM | POA: Insufficient documentation

## 2019-09-02 MED ORDER — TETANUS-DIPHTH-ACELL PERTUSSIS 5-2.5-18.5 LF-MCG/0.5 IM SUSP: 0.5000 mL | Freq: Once | INTRAMUSCULAR | 0 refills | Status: AC

## 2019-09-02 MED ORDER — INFLUENZA VAC SPLIT QUAD 0.5 ML IM SUSP: 0.5000 mL | Freq: Once | INTRAMUSCULAR | 0 refills | Status: AC

## 2019-09-02 NOTE — Patient Instructions (Addendum)
It was great to see you!  Our plans for today:  - We are checking some labs today, we will call you or send you a letter if they are abnormal.  - Take the prescriptions for the Tdap and flu vaccines to the pharmacy or health department to get your vaccines. - Come back in 2 weeks for your next prenatal visit. - If you develop intense pelvic cramping or contractions or if you have bleeding or stop feeling baby move, you should go to the Maternity Admissions Unit at [facility] (through the Women's entrance off of [address].)  Take care and seek immediate care sooner if you develop any concerns.   Dr. [facility]

## 2019-09-05 LAB — CERVICOVAGINAL ANCILLARY ONLY
Chlamydia: NEGATIVE
Comment: NEGATIVE
Comment: NORMAL
Neisseria Gonorrhea: NEGATIVE

## 2019-09-06 LAB — CULTURE, BETA STREP (GROUP B ONLY): Strep Gp B Culture: NEGATIVE

## 2019-09-09 ENCOUNTER — Ambulatory Visit (INDEPENDENT_AMBULATORY_CARE_PROVIDER_SITE_OTHER): Payer: Self-pay | Admitting: Family Medicine

## 2019-09-09 ENCOUNTER — Other Ambulatory Visit: Payer: Self-pay

## 2019-09-09 VITALS — BP 98/52 | HR 86 | Wt 176.0 lb

## 2019-09-09 DIAGNOSIS — Z3482 Encounter for supervision of other normal pregnancy, second trimester: Secondary | ICD-10-CM

## 2019-09-09 NOTE — Progress Notes (Signed)
Donna [name] is a 18 y.o. G2P1001 at [redacted]w[redacted]d here for routine follow up.  She reports she is doing well.  Endorses occasional contractions, but there frequent.  Patient denies any vaginal bleeding, loss of fluids, abdominal pain.  Baby is moving around appropriate. See flow sheet for details.  Fetal heart rate: 125 Fundal height: 36 cm GYN: Normal appearing labia, vagina.  Cervix has what appears to be nabothian cyst at 12:00.  No cervical motion tenderness.  Bimanual exam normal.  Chaperoned with April.   A/P: Pregnancy at [redacted]w[redacted]d. Doing well.   Pregnancy issues include adopt a mom.  Infant feeding choice: Breast Contraception choice: Liletta IUD Infant circumcision desired: yes  GBS and gc/chlamydia testing results were reviewed today.   Labor and fetal movement precautions reviewed. Follow up 1 week.

## 2019-09-09 NOTE — Patient Instructions (Signed)
It was a pleasure to see you today! Thank you for choosing [facility] for your primary care. Donna [name] was seen for [facility] clinic.   Your you are doing well today.  Please follow-up in 1 week for OB check.    Please go to the maternity admissions unit at [facility] if you develop any of the symptoms of vaginal bleeding, leakage of fluid, decreased fetal movement, or you going to labor with regular contractions.   Best,  Marny Lowenstein, MD, MS FAMILY MEDICINE RESIDENT - PGY3 09/09/2019 10:35 AM

## 2019-09-17 ENCOUNTER — Ambulatory Visit (INDEPENDENT_AMBULATORY_CARE_PROVIDER_SITE_OTHER): Payer: Self-pay | Admitting: Family Medicine

## 2019-09-17 ENCOUNTER — Other Ambulatory Visit: Payer: Self-pay

## 2019-09-17 VITALS — BP 105/70 | HR 92 | Wt 177.0 lb

## 2019-09-17 DIAGNOSIS — O4443 Low lying placenta NOS or without hemorrhage, third trimester: Secondary | ICD-10-CM

## 2019-09-17 DIAGNOSIS — Z3483 Encounter for supervision of other normal pregnancy, third trimester: Secondary | ICD-10-CM

## 2019-09-17 NOTE — Patient Instructions (Signed)
If you have any contractions which occur 7-10 minutes apart, vaginal bleeding, fluid leaking, or are worried that baby is not moving well, go immediately to the Maternity Admissions Unit at the [facility] at [facility] to be evaluated.   Be well, Dr. Pollie Meyer    Third Trimester of Pregnancy The third trimester is from week 28 through week 40 (months 7 through 9). The third trimester is a time when the unborn baby (fetus) is growing rapidly. At the end of the ninth month, the fetus is about 20 inches in length and weighs 6-10 pounds. Body changes during your third trimester Your body will continue to go through many changes during pregnancy. The changes vary from woman to woman. During the third trimester:  Your weight will continue to increase. You can expect to gain 25-35 pounds (11-16 kg) by the end of the pregnancy.  You may begin to get stretch marks on your hips, abdomen, and breasts.  You may urinate more often because the fetus is moving lower into your pelvis and pressing on your bladder.  You may develop or continue to have heartburn. This is caused by increased hormones that slow down muscles in the digestive tract.  You may develop or continue to have constipation because increased hormones slow digestion and cause the muscles that push waste through your intestines to relax.  You may develop hemorrhoids. These are swollen veins (varicose veins) in the rectum that can itch or be painful.  You may develop swollen, bulging veins (varicose veins) in your legs.  You may have increased body aches in the pelvis, back, or thighs. This is due to weight gain and increased hormones that are relaxing your joints.  You may have changes in your hair. These can include thickening of your hair, rapid growth, and changes in texture. Some women also have hair loss during or after pregnancy, or hair that feels dry or thin. Your hair will most likely return to normal  after your baby is born.  Your breasts will continue to grow and they will continue to become tender. A yellow fluid (colostrum) may leak from your breasts. This is the first milk you are producing for your baby.  Your belly button may stick out.  You may notice more swelling in your hands, face, or ankles.  You may have increased tingling or numbness in your hands, arms, and legs. The skin on your belly may also feel numb.  You may feel short of breath because of your expanding uterus.  You may have more problems sleeping. This can be caused by the size of your belly, increased need to urinate, and an increase in your body's metabolism.  You may notice the fetus "dropping," or moving lower in your abdomen (lightening).  You may have increased vaginal discharge.  You may notice your joints feel loose and you may have pain around your pelvic bone. What to expect at prenatal visits You will have prenatal exams every 2 weeks until week 36. Then you will have weekly prenatal exams. During a routine prenatal visit:  You will be weighed to make sure you and the baby are growing normally.  Your blood pressure will be taken.  Your abdomen will be measured to track your baby's growth.  The fetal heartbeat will be listened to.  Any test results from the previous visit will be discussed.  You may have a cervical check near your due date to see if your cervix has softened  or thinned (effaced).  You will be tested for Group B streptococcus. This happens between 35 and 37 weeks. Your health care provider may ask you:  What your birth plan is.  How you are feeling.  If you are feeling the baby move.  If you have had any abnormal symptoms, such as leaking fluid, bleeding, severe headaches, or abdominal cramping.  If you are using any tobacco products, including cigarettes, chewing tobacco, and electronic cigarettes.  If you have any questions. Other tests or screenings that may be  performed during your third trimester include:  Blood tests that check for low iron levels (anemia).  Fetal testing to check the health, activity level, and growth of the fetus. Testing is done if you have certain medical conditions or if there are problems during the pregnancy.  Nonstress test (NST). This test checks the health of your baby to make sure there are no signs of problems, such as the baby not getting enough oxygen. During this test, a belt is placed around your belly. The baby is made to move, and its heart rate is monitored during movement. What is false labor? False labor is a condition in which you feel small, irregular tightenings of the muscles in the womb (contractions) that usually go away with rest, changing position, or drinking water. These are called Braxton Hicks contractions. Contractions may last for hours, days, or even weeks before true labor sets in. If contractions come at regular intervals, become more frequent, increase in intensity, or become painful, you should see your health care provider. What are the signs of labor?  Abdominal cramps.  Regular contractions that start at 10 minutes apart and become stronger and more frequent with time.  Contractions that start on the top of the uterus and spread down to the lower abdomen and back.  Increased pelvic pressure and dull back pain.  A watery or bloody mucus discharge that comes from the vagina.  Leaking of amniotic fluid. This is also known as your "water breaking." It could be a slow trickle or a gush. Let your health care provider know if it has a color or strange odor. If you have any of these signs, call your health care provider right away, even if it is before your due date. Follow these instructions at home: Medicines  Follow your health care provider's instructions regarding medicine use. Specific medicines may be either safe or unsafe to take during pregnancy.  Take a prenatal vitamin that  contains at least 600 micrograms (mcg) of folic acid.  If you develop constipation, try taking a stool softener if your health care provider approves. Eating and drinking   Eat a balanced diet that includes fresh fruits and vegetables, whole grains, good sources of protein such as meat, eggs, or tofu, and low-fat dairy. Your health care provider will help you determine the amount of weight gain that is right for you.  Avoid raw meat and uncooked cheese. These carry germs that can cause birth defects in the baby.  If you have low calcium intake from food, talk to your health care provider about whether you should take a daily calcium supplement.  Eat four or five small meals rather than three large meals a day.  Limit foods that are high in fat and processed sugars, such as fried and sweet foods.  To prevent constipation: ? Drink enough fluid to keep your urine clear or pale yellow. ? Eat foods that are high in fiber, such as fresh fruits  and vegetables, whole grains, and beans. Activity  Exercise only as directed by your health care provider. Most women can continue their usual exercise routine during pregnancy. Try to exercise for 30 minutes at least 5 days a week. Stop exercising if you experience uterine contractions.  Avoid heavy lifting.  Do not exercise in extreme heat or humidity, or at high altitudes.  Wear low-heel, comfortable shoes.  Practice good posture.  You may continue to have sex unless your health care provider tells you otherwise. Relieving pain and discomfort  Take frequent breaks and rest with your legs elevated if you have leg cramps or low back pain.  Take warm sitz baths to soothe any pain or discomfort caused by hemorrhoids. Use hemorrhoid cream if your health care provider approves.  Wear a good support bra to prevent discomfort from breast tenderness.  If you develop varicose veins: ? Wear support pantyhose or compression stockings as told by your  healthcare provider. ? Elevate your feet for 15 minutes, 3-4 times a day. Prenatal care  Write down your questions. Take them to your prenatal visits.  Keep all your prenatal visits as told by your health care provider. This is important. Safety  Wear your seat belt at all times when driving.  Make a list of emergency phone numbers, including numbers for family, friends, the hospital, and police and fire departments. General instructions  Avoid cat litter boxes and soil used by cats. These carry germs that can cause birth defects in the baby. If you have a cat, ask someone to clean the litter box for you.  Do not travel far distances unless it is absolutely necessary and only with the approval of your health care provider.  Do not use hot tubs, steam rooms, or saunas.  Do not drink alcohol.  Do not use any products that contain nicotine or tobacco, such as cigarettes and e-cigarettes. If you need help quitting, ask your health care provider.  Do not use any medicinal herbs or unprescribed drugs. These chemicals affect the formation and growth of the baby.  Do not douche or use tampons or scented sanitary pads.  Do not cross your legs for long periods of time.  To prepare for the arrival of your baby: ? Take prenatal classes to understand, practice, and ask questions about labor and delivery. ? Make a trial run to the hospital. ? Visit the hospital and tour the maternity area. ? Arrange for maternity or paternity leave through employers. ? Arrange for family and friends to take care of pets while you are in the hospital. ? Purchase a rear-facing car seat and make sure you know how to install it in your car. ? Pack your hospital bag. ? Prepare the baby's nursery. Make sure to remove all pillows and stuffed animals from the baby's crib to prevent suffocation.  Visit your dentist if you have not gone during your pregnancy. Use a soft toothbrush to brush your teeth and be gentle  when you floss. Contact a health care provider if:  You are unsure if you are in labor or if your water has broken.  You become dizzy.  You have mild pelvic cramps, pelvic pressure, or nagging pain in your abdominal area.  You have lower back pain.  You have persistent nausea, vomiting, or diarrhea.  You have an unusual or bad smelling vaginal discharge.  You have pain when you urinate. Get help right away if:  Your water breaks before 37 weeks.  You have  regular contractions less than 5 minutes apart before 37 weeks.  You have a fever.  You are leaking fluid from your vagina.  You have spotting or bleeding from your vagina.  You have severe abdominal pain or cramping.  You have rapid weight loss or weight gain.  You have shortness of breath with chest pain.  You notice sudden or extreme swelling of your face, hands, ankles, feet, or legs.  Your baby makes fewer than 10 movements in 2 hours.  You have severe headaches that do not go away when you take medicine.  You have vision changes. Summary  The third trimester is from week 28 through week 40, months 7 through 9. The third trimester is a time when the unborn baby (fetus) is growing rapidly.  During the third trimester, your discomfort may increase as you and your baby continue to gain weight. You may have abdominal, leg, and back pain, sleeping problems, and an increased need to urinate.  During the third trimester your breasts will keep growing and they will continue to become tender. A yellow fluid (colostrum) may leak from your breasts. This is the first milk you are producing for your baby.  False labor is a condition in which you feel small, irregular tightenings of the muscles in the womb (contractions) that eventually go away. These are called Braxton Hicks contractions. Contractions may last for hours, days, or even weeks before true labor sets in.  Signs of labor can include: abdominal cramps; regular  contractions that start at 10 minutes apart and become stronger and more frequent with time; watery or bloody mucus discharge that comes from the vagina; increased pelvic pressure and dull back pain; and leaking of amniotic fluid. This information is not intended to replace advice given to you by your health care provider. Make sure you discuss any questions you have with your health care provider. Document Released: 10/25/2001 Document Revised: 02/21/2019 Document Reviewed: 12/06/2016 Elsevier Patient Education  2020 [employer]

## 2019-09-17 NOTE — Progress Notes (Signed)
Donna [name] is a 18 y.o. G2P1001 at [redacted]w[redacted]d here for routine follow up.  She reports she is doing well. Denies regularly occurring contractions, fluid leaking, vaginal bleeding, or decreased fetal movement.  See flow sheet for details.  Patient reports she did get Tdap and Flu shot at [facility] on 11/2.  A/P: Pregnancy at [redacted]w[redacted]d. Doing well.   Pregnancy issues include prior low lying placenta, resolved on subsequent ultrasound.  Fundal height 38cm Fetal heart rate 125bpm Vertex presentation by leopolds as well as informal bedside u/s  GBS and gc/chlamydia testing results were reviewed today.   Labor and fetal movement precautions reviewed. Safe sleep and packing for hospital discussed.  Also reviewed what to expect on L&D given COVID pandemic. Follow up 1 week.   Chrisandra Netters, MD [place]

## 2019-09-24 ENCOUNTER — Other Ambulatory Visit: Payer: Self-pay

## 2019-09-24 ENCOUNTER — Encounter: Payer: Self-pay | Admitting: Family Medicine

## 2019-09-24 ENCOUNTER — Encounter (HOSPITAL_COMMUNITY): Payer: Self-pay | Admitting: *Deleted

## 2019-09-24 ENCOUNTER — Inpatient Hospital Stay (HOSPITAL_COMMUNITY)
Admission: AD | Admit: 2019-09-24 | Discharge: 2019-09-24 | Disposition: A | Discharge status: Home / Self Care | Payer: Self-pay | Attending: Obstetrics and Gynecology | Admitting: Obstetrics and Gynecology

## 2019-09-24 DIAGNOSIS — Z3689 Encounter for other specified antenatal screening: Secondary | ICD-10-CM

## 2019-09-24 DIAGNOSIS — O4443 Low lying placenta NOS or without hemorrhage, third trimester: Secondary | ICD-10-CM

## 2019-09-24 DIAGNOSIS — Z3A39 39 weeks gestation of pregnancy: Secondary | ICD-10-CM | POA: Insufficient documentation

## 2019-09-24 DIAGNOSIS — O368130 Decreased fetal movements, third trimester, not applicable or unspecified: Secondary | ICD-10-CM | POA: Diagnosis not present

## 2019-09-24 NOTE — MAU Note (Signed)
No movement felt since last night.  No bleeding or leaking.  Has been having mild irreg cramps for a wk.

## 2019-09-24 NOTE — MAU Provider Note (Signed)
History     CSN: [id number]  Arrival date and time: 09/24/19 1259   First Provider Initiated Contact with Patient 09/24/19 1416      Chief Complaint  Patient presents with  . Decreased Fetal Movement   HPI Donna [name] is a 18 y.o. G2P1001 at [redacted]w[redacted]d who presents to MAU with chief complaint of decreased fetal movement since last night. Patient states her baby is always busy first thing in the morning and at bedtime. She is unaware of interventions to trigger fetal movement. She endorses PO hydration and regular meals. She most recently ate a sandwich about one hour before her arrival in MAU. She states she was able to feel fetal movement when her MAU nurse applied the external monitors. She denies vaginal bleeding, leaking of fluid, decreased fetal movement, fever, falls, or recent illness.   Patient receives prenatal care with Family Medicine.  OB History    Gravida  2   Para  1   Term  1   Preterm      AB      Living  1     SAB      TAB      Ectopic      Multiple  0   Live Births  1           Past Medical History:  Diagnosis Date  . Medical history non-contributory     Past Surgical History:  Procedure Laterality Date  . NO PAST SURGERIES      Family History  Problem Relation Age of Onset  . Healthy Mother   . Healthy Father     Social History   Tobacco Use  . Smoking status: Never Smoker  . Smokeless tobacco: Never Used  Substance Use Topics  . Alcohol use: No  . Drug use: No    Allergies: No Known Allergies  No medications prior to admission.    Review of Systems  Gastrointestinal: Negative for abdominal pain.  Genitourinary: Negative for vaginal bleeding.  Musculoskeletal: Negative for gait problem.  All other systems reviewed and are negative.  Physical Exam   Blood pressure 112/69, pulse 88, temperature 99 F (37.2 C), temperature source Oral, resp. rate 17, weight 75.6 kg, last menstrual period 12/21/2018,  SpO2 98 %, unknown if currently breastfeeding.  Physical Exam  Nursing note and vitals reviewed. Constitutional: She is oriented to person, place, and time. She appears well-developed and well-nourished.  Cardiovascular: Normal rate.  Respiratory: Effort normal and breath sounds normal.  GI: Soft. She exhibits no distension. There is no abdominal tenderness. There is no rebound.  Gravid  Neurological: She is alert and oriented to person, place, and time.  Skin: Skin is warm and dry.  Psychiatric: She has a normal mood and affect. Her behavior is normal. Judgment and thought content normal.    MAU Course/MDM  Procedures  --Reactive tracing: baseline 135, moderate variability, positive accels, no decels --Toco: UI, rare contraction --At bedside for introduction, palpating fetal movement externally but patient continues to deny movement --Patient given button she can push at 1335. I returned to bedside at 1349 due to absence of button marks on tracing but very active movement per monitor --Patient able to detect fetal movement after repositioning to left side (at 1350) and being given cold drink --Chief complaint, vitals, tracing and intervention reviewed with Dr. Vergie Living prior to discharge  Patient Vitals for the past 24 hrs:  BP Temp Temp src Pulse Resp SpO2 Weight  09/24/19 1423  112/69 - - - - - -  09/24/19 1417 123/79 - - 88 - - -  09/24/19 1411 - - - - - 98 % -  09/24/19 1400 - - - - - 98 % -  09/24/19 1328 109/70 99 F (37.2 C) Oral 90 17 99 % -  09/24/19 1313 - - - - - - 75.6 kg   Assessment and Plan  --18 y.o. G2P1001 at [redacted]w[redacted]d  --Reactive tracing --Normotensive --Cervix 1/thick/ballotable. Vertex by suture --Patient able to detect fetal movement in left lateral  --Discharge home in stable condition  F/U: --OB appt with [facility] 09/26/19  Darlina Rumpf, CNM 09/24/2019, 4:20 PM

## 2019-09-24 NOTE — Discharge Instructions (Signed)

## 2019-09-26 ENCOUNTER — Ambulatory Visit (INDEPENDENT_AMBULATORY_CARE_PROVIDER_SITE_OTHER): Payer: Self-pay | Admitting: Family Medicine

## 2019-09-26 ENCOUNTER — Other Ambulatory Visit: Payer: Self-pay

## 2019-09-26 VITALS — BP 108/62 | HR 85 | Wt 176.8 lb

## 2019-09-26 DIAGNOSIS — O480 Post-term pregnancy: Secondary | ICD-10-CM

## 2019-09-26 DIAGNOSIS — Z3483 Encounter for supervision of other normal pregnancy, third trimester: Secondary | ICD-10-CM

## 2019-09-26 NOTE — Progress Notes (Signed)
  Patient Name: Donna [name] Date of Birth: 06/09/2001 Date of Visit: 09/26/19 PCP: Bonnita Hollow, MD Third Trimester Prenatal Visit   Chief Complaint: prenatal care  Subjective: Donna [name] is a pleasant G2P1001 at  [redacted]w[redacted]d dated by LMP. She has no unusual complaints today although she lost her mucus plug yesterday with occasional bloody discharge that has now resolved. She is experiencing intermittent irregular contractions. She denies vaginal bleeding or discharge currently. She reports good fetal movement. She would like to be induced and has a preference of epidural for pain control during labor. She plans to deliver vaginally and breast feed. She would like a postpartum IUD.   ROS: per HPI.   I have reviewed the patient's medical, surgical, family, and social history as appropriate.    Vitals:   09/26/19 0944  BP: 108/62  Pulse: 85   Last Weight  Most recent update: 09/26/2019  9:44 AM   Weight  80.2 kg (176 lb 12.8 oz)           EFW/Presentation by Curlene Labrum: 7lbs/ Vertex  See flow sheet for exam.  Fetal heart tones documented in flow sheet  Fundal height documented in flow sheet   Donna [name] is a pleasant G2P1001 at [redacted]w[redacted]d presenting for routine third trimester prenatal care. Overall she is doing well. She is schedule for a BPP w/ NST 11/16 @ 10:15am. She is scheduled for induction 10/04/19. Reasons to present to MAU given in AVS.  Anticipatory Guidance and Prenatal Education provided on the following topics: - Recommended continuing Prenatal vitamin  - Discussed options for Contraception postpartum - Discussed mode of delivery - Discussed pain control in labor - Discussed  breastfeeding and benefits for infant  - Discussed safe sleep recommendations and car seat recommendations  - Problem list and prenatal box updated    Follow up as needed until delivered. *Induction scheduled for next Friday 10/04/2019.

## 2019-09-26 NOTE — Patient Instructions (Addendum)
It was very nice to meet you today. Please enjoy the rest of your week. Today you were seen for a routine prenatal visit.   You are scheduled for a ultrasound Monday 09/30/2019 @10 :15am.  Induction schedule next Friday, you will be called with details.  Got to MAU with any leakage of fluid, decrease fetal movement, increase in vaginal bleeding or you think you are in labor.    Braxton Hicks Contractions Contractions of the uterus can occur throughout pregnancy, but they are not always a sign that you are in labor. You may have practice contractions called Braxton Hicks contractions. These false labor contractions are sometimes confused with true labor. What are [place] contractions? Braxton Hicks contractions are tightening movements that occur in the muscles of the uterus before labor. Unlike true labor contractions, these contractions do not result in opening (dilation) and thinning of the cervix. Toward the end of pregnancy (32-34 weeks), Braxton Hicks contractions can happen more often and may become stronger. These contractions are sometimes difficult to tell apart from true labor because they can be very uncomfortable. You should not feel embarrassed if you go to the hospital with false labor. Sometimes, the only way to tell if you are in true labor is for your health care provider to look for changes in the cervix. The health care provider will do a physical exam and may monitor your contractions. If you are not in true labor, the exam should show that your cervix is not dilating and your water has not broken. If there are no other health problems associated with your pregnancy, it is completely safe for you to be sent home with false labor. You may continue to have Braxton Hicks contractions until you go into true labor. How to tell the difference between true labor and false labor True labor  Contractions last 30-70 seconds.  Contractions become very regular.  Discomfort is  usually felt in the top of the uterus, and it spreads to the lower abdomen and low back.  Contractions do not go away with walking.  Contractions usually become more intense and increase in frequency.  The cervix dilates and gets thinner. False labor  Contractions are usually shorter and not as strong as true labor contractions.  Contractions are usually irregular.  Contractions are often felt in the front of the lower abdomen and in the groin.  Contractions may go away when you walk around or change positions while lying down.  Contractions get weaker and are shorter-lasting as time goes on.  The cervix usually does not dilate or become thin. Follow these instructions at home:   Take over-the-counter and prescription medicines only as told by your health care provider.  Keep up with your usual exercises and follow other instructions from your health care provider.  Eat and drink lightly if you think you are going into labor.  If Braxton Hicks contractions are making you uncomfortable: ? Change your position from lying down or resting to walking, or change from walking to resting. ? Sit and rest in a tub of warm water. ? Drink enough fluid to keep your urine pale yellow. Dehydration may cause these contractions. ? Do slow and deep breathing several times an hour.  Keep all follow-up prenatal visits as told by your health care provider. This is important. Contact a health care provider if:  You have a fever.  You have continuous pain in your abdomen. Get help right away if:  Your contractions become stronger, more regular,  and closer together.  You have fluid leaking or gushing from your vagina.  You pass blood-tinged mucus (bloody show).  You have bleeding from your vagina.  You have low back pain that you never had before.  You feel your baby's head pushing down and causing pelvic pressure.  Your baby is not moving inside you as much as it used to. Summary   Contractions that occur before labor are called Braxton Hicks contractions, false labor, or practice contractions.  Braxton Hicks contractions are usually shorter, weaker, farther apart, and less regular than true labor contractions. True labor contractions usually become progressively stronger and regular, and they become more frequent.  Manage discomfort from [facility] contractions by changing position, resting in a warm bath, drinking plenty of water, or practicing deep breathing. This information is not intended to replace advice given to you by your health care provider. Make sure you discuss any questions you have with your health care provider. Document Released: 03/16/2017 Document Revised: 10/13/2017 Document Reviewed: 03/16/2017 Elsevier Patient Education  2020 [employer].

## 2019-09-27 ENCOUNTER — Telehealth (HOSPITAL_COMMUNITY): Payer: Self-pay | Admitting: *Deleted

## 2019-09-27 ENCOUNTER — Encounter (HOSPITAL_COMMUNITY): Payer: Self-pay | Admitting: *Deleted

## 2019-09-27 NOTE — Telephone Encounter (Signed)
Preadmission screen  

## 2019-09-30 ENCOUNTER — Ambulatory Visit: Payer: Self-pay

## 2019-09-30 ENCOUNTER — Inpatient Hospital Stay (HOSPITAL_COMMUNITY): Payer: Medicaid Other | Admitting: Anesthesiology

## 2019-09-30 ENCOUNTER — Encounter (HOSPITAL_COMMUNITY): Payer: Self-pay | Admitting: *Deleted

## 2019-09-30 ENCOUNTER — Ambulatory Visit (INDEPENDENT_AMBULATORY_CARE_PROVIDER_SITE_OTHER): Payer: Self-pay | Admitting: *Deleted

## 2019-09-30 ENCOUNTER — Inpatient Hospital Stay (HOSPITAL_COMMUNITY)
Admission: AD | Admit: 2019-09-30 | Discharge: 2019-10-03 | DRG: 807 | Disposition: A | Discharge status: Home / Self Care | Payer: Medicaid Other | Attending: Obstetrics and Gynecology | Admitting: Obstetrics and Gynecology

## 2019-09-30 ENCOUNTER — Other Ambulatory Visit: Payer: Self-pay

## 2019-09-30 VITALS — BP 115/67 | HR 92 | Wt 176.1 lb

## 2019-09-30 DIAGNOSIS — Z30430 Encounter for insertion of intrauterine contraceptive device: Secondary | ICD-10-CM | POA: Diagnosis not present

## 2019-09-30 DIAGNOSIS — E669 Obesity, unspecified: Secondary | ICD-10-CM | POA: Diagnosis present

## 2019-09-30 DIAGNOSIS — O99214 Obesity complicating childbirth: Secondary | ICD-10-CM | POA: Diagnosis present

## 2019-09-30 DIAGNOSIS — O480 Post-term pregnancy: Secondary | ICD-10-CM

## 2019-09-30 DIAGNOSIS — Z3A40 40 weeks gestation of pregnancy: Secondary | ICD-10-CM | POA: Diagnosis not present

## 2019-09-30 DIAGNOSIS — O4103X0 Oligohydramnios, third trimester, not applicable or unspecified: Principal | ICD-10-CM | POA: Diagnosis present

## 2019-09-30 DIAGNOSIS — Z20828 Contact with and (suspected) exposure to other viral communicable diseases: Secondary | ICD-10-CM | POA: Diagnosis present

## 2019-09-30 DIAGNOSIS — O283 Abnormal ultrasonic finding on antenatal screening of mother: Secondary | ICD-10-CM | POA: Diagnosis not present

## 2019-09-30 DIAGNOSIS — O4100X0 Oligohydramnios, unspecified trimester, not applicable or unspecified: Secondary | ICD-10-CM

## 2019-09-30 DIAGNOSIS — O4443 Low lying placenta NOS or without hemorrhage, third trimester: Secondary | ICD-10-CM

## 2019-09-30 DIAGNOSIS — Z975 Presence of (intrauterine) contraceptive device: Secondary | ICD-10-CM

## 2019-09-30 LAB — CBC
HCT: 40.3 % (ref 36.0–46.0)
Hemoglobin: 13.7 g/dL (ref 12.0–15.0)
MCH: 30.9 pg (ref 26.0–34.0)
MCHC: 34 g/dL (ref 30.0–36.0)
MCV: 91 fL (ref 80.0–100.0)
Platelets: 195 10*3/uL (ref 150–400)
RBC: 4.43 MIL/uL (ref 3.87–5.11)
RDW: 12.9 % (ref 11.5–15.5)
WBC: 9.1 10*3/uL (ref 4.0–10.5)
nRBC: 0 % (ref 0.0–0.2)

## 2019-09-30 LAB — TYPE AND SCREEN
ABO/RH(D): O POS
Antibody Screen: NEGATIVE

## 2019-09-30 LAB — SARS CORONAVIRUS 2 (TAT 6-24 HRS): SARS Coronavirus 2: NEGATIVE

## 2019-09-30 LAB — ABO/RH: ABO/RH(D): O POS

## 2019-09-30 MED ORDER — DIPHENHYDRAMINE HCL 50 MG/ML IJ SOLN: 12.5000 mg | INTRAMUSCULAR | Status: DC | PRN

## 2019-09-30 MED ORDER — LACTATED RINGERS IV SOLN
500.0000 mL | INTRAVENOUS | Status: DC | PRN
  Administered 2019-09-30: 500 mL via INTRAVENOUS

## 2019-09-30 MED ORDER — OXYTOCIN BOLUS FROM INFUSION
500.0000 mL | Freq: Once | INTRAVENOUS | Status: AC
  Administered 2019-10-01: 500 mL via INTRAVENOUS

## 2019-09-30 MED ORDER — LIDOCAINE HCL (PF) 1 % IJ SOLN: 30.0000 mL | INTRAMUSCULAR | Status: DC | PRN

## 2019-09-30 MED ORDER — LACTATED RINGERS IV SOLN: 500.0000 mL | Freq: Once | INTRAVENOUS | Status: DC

## 2019-09-30 MED ORDER — EPHEDRINE 5 MG/ML INJ: 10.0000 mg | INTRAVENOUS | Status: DC | PRN

## 2019-09-30 MED ORDER — MISOPROSTOL 50MCG HALF TABLET
50.0000 ug | ORAL_TABLET | ORAL | Status: DC | PRN
  Administered 2019-09-30: 15:00:00 50 ug via BUCCAL
  Filled 2019-09-30: qty 1

## 2019-09-30 MED ORDER — PHENYLEPHRINE 40 MCG/ML (10ML) SYRINGE FOR IV PUSH (FOR BLOOD PRESSURE SUPPORT)
80.0000 ug | PREFILLED_SYRINGE | INTRAVENOUS | Status: DC | PRN
  Administered 2019-09-30: 80 ug via INTRAVENOUS

## 2019-09-30 MED ORDER — OXYTOCIN 40 UNITS IN NORMAL SALINE INFUSION - SIMPLE MED
2.5000 [IU]/h | INTRAVENOUS | Status: DC
  Filled 2019-09-30: qty 1000

## 2019-09-30 MED ORDER — SODIUM CHLORIDE (PF) 0.9 % IJ SOLN
INTRAMUSCULAR | Status: DC | PRN
  Administered 2019-09-30: 12 mL/h via EPIDURAL

## 2019-09-30 MED ORDER — LACTATED RINGERS IV SOLN
INTRAVENOUS | Status: DC
  Administered 2019-09-30: 14:00:00 via INTRAVENOUS

## 2019-09-30 MED ORDER — ONDANSETRON HCL 4 MG/2ML IJ SOLN: 4.0000 mg | Freq: Four times a day (QID) | INTRAMUSCULAR | Status: DC | PRN

## 2019-09-30 MED ORDER — LEVONORGESTREL 19.5 MCG/DAY IU IUD
INTRAUTERINE_SYSTEM | Freq: Once | INTRAUTERINE | Status: AC
  Administered 2019-10-01: 1 via INTRAUTERINE
  Filled 2019-09-30: qty 1

## 2019-09-30 MED ORDER — SOD CITRATE-CITRIC ACID 500-334 MG/5ML PO SOLN: 30.0000 mL | ORAL | Status: DC | PRN

## 2019-09-30 MED ORDER — PHENYLEPHRINE 40 MCG/ML (10ML) SYRINGE FOR IV PUSH (FOR BLOOD PRESSURE SUPPORT)
80.0000 ug | PREFILLED_SYRINGE | INTRAVENOUS | Status: DC | PRN
  Filled 2019-09-30: qty 10

## 2019-09-30 MED ORDER — TERBUTALINE SULFATE 1 MG/ML IJ SOLN: 0.2500 mg | Freq: Once | INTRAMUSCULAR | Status: DC | PRN

## 2019-09-30 MED ORDER — FENTANYL-BUPIVACAINE-NACL 0.5-0.125-0.9 MG/250ML-% EP SOLN
12.0000 mL/h | EPIDURAL | Status: DC | PRN
  Filled 2019-09-30: qty 250

## 2019-09-30 MED ORDER — LIDOCAINE HCL (PF) 1 % IJ SOLN
INTRAMUSCULAR | Status: DC | PRN
  Administered 2019-09-30: 10 mL via EPIDURAL
  Administered 2019-09-30: 2 mL via EPIDURAL

## 2019-09-30 NOTE — H&P (Addendum)
OBSTETRIC ADMISSION HISTORY AND PHYSICAL  Donna [name] is a 18 y.o. female G2P1001 with IUP at [redacted]w[redacted]d presenting for IOL for oligohydramnios. AFI 3.1, BPP 6/8 in office today. She reports +FMs. No VB, blurry vision, headaches, peripheral edema, or RUQ pain. Reports leaking small amt of clear watery discharge x2 days. She plans on breastfeeding. She requests postpartum IUD for birth control.  Dating: By ultrasound --->  Estimated Date of Delivery: 09/27/19  Sono:    @ [redacted]w[redacted]d, CWD, normal anatomy, vertex presentation, 2400g, 65.5%.  Prenatal History/Complications: Oligohydramnios - AFI 3.1 Low-lying placenta during third trimester - resolved Teen pregnancy  Past Medical History: Past Medical History:  Diagnosis Date  . Medical history non-contributory     Past Surgical History: Past Surgical History:  Procedure Laterality Date  . NO PAST SURGERIES      Obstetrical History: OB History    Gravida  2   Para  1   Term  1   Preterm      AB      Living  1     SAB      TAB      Ectopic      Multiple  0   Live Births  1           Social History: Social History   Socioeconomic History  . Marital status: Single    Spouse name: Not on file  . Number of children: Not on file  . Years of education: Not on file  . Highest education level: Not on file  Occupational History  . Not on file  Social Needs  . Financial resource strain: Not on file  . Food insecurity    Worry: Not on file    Inability: Not on file  . Transportation needs    Medical: Not on file    Non-medical: Not on file  Tobacco Use  . Smoking status: Never Smoker  . Smokeless tobacco: Never Used  Substance and Sexual Activity  . Alcohol use: No  . Drug use: No  . Sexual activity: Not Currently    Birth control/protection: I.U.D.  Lifestyle  . Physical activity    Days per week: Not on file    Minutes per session: Not on file  . Stress: Not on file  Relationships  .  Social Musician on phone: Not on file    Gets together: Not on file    Attends religious service: Not on file    Active member of club or organization: Not on file    Attends meetings of clubs or organizations: Not on file    Relationship status: Not on file  Other Topics Concern  . Not on file  Social History Narrative  . Not on file    Family History: Family History  Problem Relation Age of Onset  . Healthy Mother   . Healthy Father     Allergies: No Known Allergies  Medications Prior to Admission  Medication Sig Dispense Refill Last Dose  . Prenatal Vit-Fe Fumarate-FA (PRENATAL VITAMINS) 28-0.8 MG TABS Take 1 tablet by mouth daily. 30 tablet 6      Review of Systems   All systems reviewed and negative except as stated in HPI  Last menstrual period 12/21/2018, unknown if currently breastfeeding. General appearance: alert, cooperative and no distress Lungs: regular rate and effort Heart: regular rate  Abdomen: soft, non-tender Extremities: Homans sign is negative, no sign of DVT Presentation: cephalic Fetal monitoringBaseline: 140  bpm, Variability: Good {> 6 bpm), Accelerations: Reactive and Decelerations: Absent Uterine activityFrequency: Every 2-5 minutes   SVE: 1/50/-1  Prenatal labs: ABO, Rh: O/Positive/-- (04/29 1517) Antibody: Negative (04/29 1517) Rubella: 3.89 (04/29 1517) RPR: Non Reactive (08/25 1439)  HBsAg: Negative (04/29 1517)  HIV: Non Reactive (08/25 1439)  GBS: Negative/-- (10/19 1658)  2 hr GTT: 116  Prenatal Transfer Tool  Maternal Diabetes: No Genetic Screening: Normal Maternal Ultrasounds/Referrals: Normal Fetal Ultrasounds or other Referrals:  None Maternal Substance Abuse:  No Significant Maternal Medications:  None Significant Maternal Lab Results: Group B Strep negative  No results found for this or any previous visit (from the past 24 hour(s)).  Patient Active Problem List   Diagnosis Date Noted  . Low lying  placenta nos or without hemorrhage, third trimester 07/09/2019  . Supervision of normal pregnancy 04/03/2019   Assessment: Donna [name] is a 18 y.o. G2P1001 at [redacted]w[redacted]d here for IOL for oligohydramnios.  1. Labor: Latent labor 2. FWB: Category I 3. Pain: Planning for epidural 4. GBS: Negative   Plan: Admit to LD Buccal Cytotec and Foley Bulb now - Plan Pitocin augmentation following per protocol Anesthesia/Analgesia PRN - Planning for epidural Anticipate NSVD  Juanna Cao, Student-MidWife  09/30/2019, 1:57 PM   Midwife attestation: I have seen and examined this patient; I agree with above documentation in the student's note.   PE: Gen: calm comfortable, NAD Resp: normal effort and rate Abd: gravid  ROS, labs, PMH reviewed  Assessment/Plan: Donna [name] is a 18 y.o. G2P1001 here for suspected PROM at term Admit to LD Labor: latent FWB: Cat I GBS neg Cervical ripening now Anticipate SVD  Julianne Handler, CNM  09/30/2019, 3:33 PM

## 2019-09-30 NOTE — Anesthesia Procedure Notes (Signed)
Epidural Patient location during procedure: OB Start time: 09/30/2019 8:30 PM End time: 09/30/2019 8:40 PM  Staffing Anesthesiologist: Pervis Hocking, DO Performed: anesthesiologist   Preanesthetic Checklist Completed: patient identified, pre-op evaluation, timeout performed, IV checked, risks and benefits discussed and monitors and equipment checked  Epidural Patient position: sitting Prep: site prepped and draped and DuraPrep Patient monitoring: continuous pulse ox, blood pressure, heart rate and cardiac monitor Approach: midline Location: L3-L4 Injection technique: LOR air  Needle:  Needle type: Tuohy  Needle gauge: 17 G Needle length: 9 cm Needle insertion depth: 6.5 cm Catheter type: closed end flexible Catheter size: 19 Gauge Catheter at skin depth: 12 cm Test dose: negative  Assessment Sensory level: T8 Events: blood not aspirated, injection not painful, no injection resistance, negative IV test and no paresthesia  Additional Notes Patient identified. Risks/Benefits/Options discussed with patient including but not limited to bleeding, infection, nerve damage, paralysis, failed block, incomplete pain control, headache, blood pressure changes, nausea, vomiting, reactions to medication both or allergic, itching and postpartum back pain. Confirmed with bedside nurse the patient's most recent platelet count. Confirmed with patient that they are not currently taking any anticoagulation, have any bleeding history or any family history of bleeding disorders. Patient expressed understanding and wished to proceed. All questions were answered. Sterile technique was used throughout the entire procedure. Please see nursing notes for vital signs. Test dose was given through epidural catheter and negative prior to continuing to dose epidural or start infusion. Warning signs of high block given to the patient including shortness of breath, tingling/numbness in hands, complete motor  block, or any concerning symptoms with instructions to call for help. Patient was given instructions on fall risk and not to get out of bed. All questions and concerns addressed with instructions to call with any issues or inadequate analgesia.  Reason for block:procedure for pain

## 2019-09-30 NOTE — Plan of Care (Signed)
completed

## 2019-09-30 NOTE — MAU Note (Signed)
Asymptomatic, swab collected. Pt sent to L&D

## 2019-09-30 NOTE — Progress Notes (Addendum)
Donna [name] is a 18 y.o. G2P1001 at [redacted]w[redacted]d by ultrasound admitted for oligohydramnios.  Subjective: Reporting mild intermittent pelvic discomfort with contractions at this time. Is breathing well through contractions and requesting epidural. FOB present and supportive at bedside.  Objective: Vitals:   09/30/19 1425 09/30/19 1600 09/30/19 1830 09/30/19 1938  BP: 108/67 117/74  123/79  Pulse: 90 86  71  Resp: 16 18 16    Temp:  99.3 F (37.4 C)  98.9 F (37.2 C)  TempSrc:  Oral  Oral  Weight:      Height:        No intake/output data recorded. No intake/output data recorded.   FHT:  FHR: 130 bpm, variability: moderate,  accelerations:  Present,  decelerations:  Absent UC:   regular, every 2-4 minutes SVE:   Dilation: 5 Effacement (%): 90 Station: -1 Exam by:: [employer]  Labs:   Recent Labs    09/30/19 1706  WBC 9.1  HGB 13.7  HCT 40.3  PLT 195    Assessment / Plan:  18 y.o. G2P1001 at [redacted]w[redacted]d by ultrasound admitted for oligohydramnios  Labor: Progressing normally - foley bulb spontaneously expelled at 1800. Continue expectant management for now. Preeclampsia:  no signs or symptoms of toxicity and labs stable Fetal Wellbeing:  Category I Pain Control:  Labor support without medications - planning for epidural I/D:  GBS negative Anticipated MOD:  NSVD  Juanna Cao, SNM, BSN 09/30/2019, 7:39 PM   Midwife attestation I agree with the documentation in the student's note.   Julianne Handler, CNM 8:07 PM

## 2019-09-30 NOTE — Progress Notes (Signed)
Donna [name] is a 18 y.o. G2P1001 at [redacted]w[redacted]d by ultrasound admitted for oligohydramnios.  Subjective: Comfortable with epidural.  Objective: Vitals:   09/30/19 2101 09/30/19 2105 09/30/19 2111 09/30/19 2131  BP: 114/61 107/62 109/65 116/68  Pulse: 79 80 73 82  Resp:      Temp:      TempSrc:      SpO2: 100% 100% 100%   Weight:      Height:        No intake/output data recorded. No intake/output data recorded.   FHT:  FHR: 135 bpm, variability: moderate,  accelerations:  Present,  decelerations:  Absent UC:   regular, every 2-5 minutes SVE:   Dilation: 6 Effacement (%): 90 Station: -1 Exam by:: [employer]:   Recent Labs    09/30/19 1706  WBC 9.1  HGB 13.7  HCT 40.3  PLT 195    Assessment / Plan:  18 y.o. G2P1001 at [redacted]w[redacted]d by ultrasound admitted for oligohydramnios  Labor: Progressing normally - s/p FB and Cytotec. Continue expectant management for now. Pitocin augmentation as needed. Anticipate SVD. Preeclampsia:  no signs or symptoms of toxicity and labs stable Fetal Wellbeing:  Category I Pain Control:  Labor support without medications - planning for epidural I/D:  GBS negative Anticipated MOD:  NSVD  Barrington Ellison, MD OB Family Medicine Fellow, [facility] for [employer], [place]

## 2019-09-30 NOTE — Progress Notes (Signed)
Pt reports intermittent clear fluid leaking from vagina x2 days. Fetal testing results discussed with Dr. Hulan Fray. Plan of care for pt is to have direct admit to L&D for IOL today due to oligohydramnios. Pt informed of plan and she agreed to arrive to [facility] in about 2 hours. Report called to [facility].

## 2019-09-30 NOTE — Anesthesia Preprocedure Evaluation (Signed)
Anesthesia Evaluation  Patient identified by MRN, date of birth, ID band Patient awake    Reviewed: Allergy & Precautions, NPO status , Patient's Chart, lab work & pertinent test results  History of Anesthesia Complications Negative for: history of anesthetic complications  Airway Mallampati: II  TM Distance: >3 FB Neck ROM: Full    Dental  (+) Teeth Intact   Pulmonary neg pulmonary ROS,    breath sounds clear to auscultation       Cardiovascular negative cardio ROS   Rhythm:Regular     Neuro/Psych negative neurological ROS  negative psych ROS   GI/Hepatic negative GI ROS, Neg liver ROS,   Endo/Other  Obesity BMI 34  Renal/GU negative Renal ROS  negative genitourinary   Musculoskeletal   Abdominal   Peds  Hematology negative hematology ROS (+)   Anesthesia Other Findings   Reproductive/Obstetrics (+) Pregnancy                             Anesthesia Physical  Anesthesia Plan  ASA: II and emergent  Anesthesia Plan: Epidural   Post-op Pain Management:    Induction:   PONV Risk Score and Plan:   Airway Management Planned: Natural Airway  Additional Equipment: None  Intra-op Plan:   Post-operative Plan:   Informed Consent: I have reviewed the patients History and Physical, chart, labs and discussed the procedure including the risks, benefits and alternatives for the proposed anesthesia with the patient or authorized representative who has indicated his/her understanding and acceptance.       Plan Discussed with:   Anesthesia Plan Comments:         Anesthesia Quick Evaluation

## 2019-10-01 ENCOUNTER — Encounter (HOSPITAL_COMMUNITY): Payer: Self-pay

## 2019-10-01 DIAGNOSIS — Z3A40 40 weeks gestation of pregnancy: Secondary | ICD-10-CM

## 2019-10-01 DIAGNOSIS — O4100X0 Oligohydramnios, unspecified trimester, not applicable or unspecified: Secondary | ICD-10-CM

## 2019-10-01 DIAGNOSIS — Z30430 Encounter for insertion of intrauterine contraceptive device: Secondary | ICD-10-CM

## 2019-10-01 LAB — RPR: RPR Ser Ql: NONREACTIVE

## 2019-10-01 MED ORDER — SENNOSIDES-DOCUSATE SODIUM 8.6-50 MG PO TABS
2.0000 | ORAL_TABLET | ORAL | Status: DC
  Administered 2019-10-02: 01:00:00 2 via ORAL
  Filled 2019-10-01: qty 2

## 2019-10-01 MED ORDER — BENZOCAINE-MENTHOL 20-0.5 % EX AERO
1.0000 "application " | INHALATION_SPRAY | CUTANEOUS | Status: DC | PRN
  Administered 2019-10-01: 1 via TOPICAL
  Filled 2019-10-01: qty 56

## 2019-10-01 MED ORDER — OXYTOCIN 40 UNITS IN NORMAL SALINE INFUSION - SIMPLE MED: 1.0000 m[IU]/min | INTRAVENOUS | Status: DC

## 2019-10-01 MED ORDER — IBUPROFEN 600 MG PO TABS
600.0000 mg | ORAL_TABLET | Freq: Three times a day (TID) | ORAL | Status: DC | PRN
  Administered 2019-10-01 – 2019-10-02 (×3): 600 mg via ORAL
  Filled 2019-10-01 (×3): qty 1

## 2019-10-01 MED ORDER — OXYTOCIN 40 UNITS IN NORMAL SALINE INFUSION - SIMPLE MED
1.0000 m[IU]/min | INTRAVENOUS | Status: DC
  Administered 2019-10-01: 2 m[IU]/min via INTRAVENOUS

## 2019-10-01 MED ORDER — DIBUCAINE (PERIANAL) 1 % EX OINT: 1.0000 "application " | TOPICAL_OINTMENT | CUTANEOUS | Status: DC | PRN

## 2019-10-01 MED ORDER — ONDANSETRON HCL 4 MG/2ML IJ SOLN: 4.0000 mg | INTRAMUSCULAR | Status: DC | PRN

## 2019-10-01 MED ORDER — MEASLES, MUMPS & RUBELLA VAC IJ SOLR: 0.5000 mL | Freq: Once | INTRAMUSCULAR | Status: DC

## 2019-10-01 MED ORDER — DIPHENHYDRAMINE HCL 25 MG PO CAPS: 25.0000 mg | ORAL_CAPSULE | Freq: Four times a day (QID) | ORAL | Status: DC | PRN

## 2019-10-01 MED ORDER — COCONUT OIL OIL: 1.0000 "application " | TOPICAL_OIL | Status: DC | PRN

## 2019-10-01 MED ORDER — WITCH HAZEL-GLYCERIN EX PADS: 1.0000 "application " | MEDICATED_PAD | CUTANEOUS | Status: DC | PRN

## 2019-10-01 MED ORDER — ACETAMINOPHEN 325 MG PO TABS
650.0000 mg | ORAL_TABLET | Freq: Four times a day (QID) | ORAL | Status: DC | PRN
  Administered 2019-10-01 – 2019-10-03 (×5): 650 mg via ORAL
  Filled 2019-10-01 (×5): qty 2

## 2019-10-01 MED ORDER — TETANUS-DIPHTH-ACELL PERTUSSIS 5-2.5-18.5 LF-MCG/0.5 IM SUSP: 0.5000 mL | Freq: Once | INTRAMUSCULAR | Status: DC

## 2019-10-01 MED ORDER — PRENATAL MULTIVITAMIN CH
1.0000 | ORAL_TABLET | Freq: Every day | ORAL | Status: DC
  Administered 2019-10-01 – 2019-10-03 (×3): 1 via ORAL
  Filled 2019-10-01 (×3): qty 1

## 2019-10-01 MED ORDER — TERBUTALINE SULFATE 1 MG/ML IJ SOLN: 0.2500 mg | Freq: Once | INTRAMUSCULAR | Status: DC | PRN

## 2019-10-01 MED ORDER — ONDANSETRON HCL 4 MG PO TABS: 4.0000 mg | ORAL_TABLET | ORAL | Status: DC | PRN

## 2019-10-01 MED ORDER — SIMETHICONE 80 MG PO CHEW: 80.0000 mg | CHEWABLE_TABLET | ORAL | Status: DC | PRN

## 2019-10-01 NOTE — Discharge Summary (Addendum)
Postpartum Discharge Summary  Date of Service updated 10/03/2019     Patient Name: Donna [name] DOB: May 07, 2001 MRN: [id number]  Date of admission: 09/30/2019 Delivering Provider: Chauncey Mann   Date of discharge: 10/03/2019  Admitting diagnosis: PREG Intrauterine pregnancy: [redacted]w[redacted]d    Secondary diagnosis:  Active Problems:   Indication for care in labor or delivery   Oligohydramnios   [redacted] weeks gestation of pregnancy   IUD (intrauterine device) in place  Additional problems: None     Discharge diagnosis: Term Pregnancy Delivered                                                                                                Post partum procedures:None  Augmentation: AROM, Pitocin, Cytotec and Foley Balloon  Complications: None  Hospital course:  Induction of Labor With Vaginal Delivery   18y.o. yo G2P1001 at 450w4das admitted to the hospital 09/30/2019 for induction of labor.  Patient had an uncomplicated labor course as follows: Patient presented to L&D for IOL for oligohydramnios and BPP 6/5. Initial SVE: 1/50/-1. Patient received Cytotec, Foley bulb, AROM, Pitocin. Received epidural. She then progressed to complete with uncomplicated delivery. IUD placed post-placentally. Membrane Rupture Time/Date: 11:59 PM ,10/01/2019   Intrapartum Procedures: Episiotomy: None [1]                                         Lacerations:  None [1]  Patient had delivery of a Viable infant.  Information for the patient's newborn:  NoLianah, [place][[id number]]Delivery Method: Vag-Spont    10/01/2019  Details of delivery can be found in separate delivery note.  Patient had a routine postpartum course. Patient is discharged home 10/03/19. Delivery time: 3:07 AM    Magnesium Sulfate received: No BMZ received: No Rhophylac:No MMR:No Transfusion:No  Physical exam  Vitals:   10/01/19 1837 10/01/19 2230 10/02/19 2250 10/03/19 0500  BP: 102/68 98/64 107/69  102/64  Pulse: 72 89 82 73  Resp: '18  18 16  ' Temp: 98.1 F (36.7 C) 98.2 F (36.8 C) 98.3 F (36.8 C) 98.3 F (36.8 C)  TempSrc: Oral Oral  Oral  SpO2: 100% 99% 99% 100%  Weight:      Height:       General: alert, cooperative and no distress Lochia: appropriate Uterine Fundus: firm DVT Evaluation: No evidence of DVT seen on physical exam. Negative Homan's sign. No cords or calf tenderness. No significant calf/ankle edema. Labs: Lab Results  Component Value Date   WBC 9.1 09/30/2019   HGB 13.7 09/30/2019   HCT 40.3 09/30/2019   MCV 91.0 09/30/2019   PLT 195 09/30/2019   CMP Latest Ref Rng & Units 04/10/2014  Glucose 70 - 99 mg/dL 82  BUN 6 - 23 mg/dL 11  Creatinine 0.10 - 1.20 mg/dL 0.61  Sodium 135 - 145 mEq/L 136  Potassium 3.5 - 5.3 mEq/L 4.5  Chloride 96 - 112 mEq/L 100  CO2 19 - 32 mEq/L 27  Calcium 8.4 - 10.5 mg/dL 10.0  Total Protein 6.0 - 8.3 g/dL 7.9  Total Bilirubin 0.2 - 1.1 mg/dL 0.5  Alkaline Phos 50 - 162 U/L 76  AST 0 - 37 U/L 24  ALT 0 - 35 U/L 13    Discharge instruction: per After Visit Summary and "Baby and Me Booklet".  After visit meds:  Allergies as of 10/03/2019   No Known Allergies     Medication List    TAKE these medications   benzocaine-Menthol 20-0.5 % Aero Commonly known as: DERMOPLAST Apply 1 application topically as needed for irritation (perineal discomfort).   coconut oil Oil Apply 1 application topically as needed.   ibuprofen 600 MG tablet Commonly known as: ADVIL Take 1 tablet (600 mg total) by mouth every 8 (eight) hours as needed for mild pain.   Prenatal Vitamins 28-0.8 MG Tabs Take 1 tablet by mouth daily.   senna-docusate 8.6-50 MG tablet Commonly known as: Senokot-S Take 2 tablets by mouth daily.   Tdap 5-2.5-18.5 LF-MCG/0.5 injection Commonly known as: BOOSTRIX Inject 0.5 mLs into the muscle once for 1 dose.       Diet: routine diet  Activity: Advance as tolerated. Pelvic rest for 6 weeks.    Outpatient follow up:4 weeks Follow up Appt: No future appointments. Follow up Visit: [place] [facility]. Schedule an appointment as soon as possible for a visit in 4 week(s).   Specialty: Obstetrics and Gynecology Why: Postpartum follow up visit  Contact information: [address] [place], [place] [id number] [place] [address] [phone]           Please schedule this patient for Postpartum visit in: 4 weeks with the following provider: Any provider Low risk pregnancy complicated by: oligo Delivery mode:  SVD Anticipated Birth Control:  IUD PP Procedures needed: none  Schedule Integrated BH visit: no      Newborn Data: Live born female  Birth Weight: 3671g APGAR: 79, 9  Newborn Delivery   Birth date/time: 10/01/2019 03:07:00 Delivery type: Vaginal, Spontaneous      Baby Feeding: Breast Disposition:home with mother   10/03/2019 Carollee Leitz, MD  OB [place]  I have seen and examined this patient and agree with above documentation in the resident's note.   Phill Myron, D.O. OB Fellow  10/03/2019, 12:28 PM

## 2019-10-01 NOTE — Anesthesia Postprocedure Evaluation (Signed)
Anesthesia Post Note  Patient: Donna [name]  Procedure(s) Performed: AN AD HOC LABOR EPIDURAL     Patient location during evaluation: Mother Baby Anesthesia Type: Epidural Level of consciousness: awake and alert Pain management: pain level controlled Vital Signs Assessment: post-procedure vital signs reviewed and stable Respiratory status: spontaneous breathing, nonlabored ventilation and respiratory function stable Cardiovascular status: stable Postop Assessment: no headache, no backache and epidural receding Anesthetic complications: no Comments: Spoke to pt on the phone. No anesthetic complications noted.    Last Vitals:  Vitals:   10/01/19 0518 10/01/19 0620  BP: 102/67 (!) 103/50  Pulse: (!) 103 96  Resp: 16 16  Temp: 36.8 C 36.6 C  SpO2: 98% 97%    Last Pain:  Vitals:   10/01/19 0749  TempSrc:   PainSc: 2    Pain Goal:                Epidural/Spinal Function Cutaneous sensation: Normal sensation (10/01/19 0749), Patient able to flex knees: Yes (10/01/19 0749), Patient able to lift hips off bed: Yes (10/01/19 0749), Back pain beyond tenderness at insertion site: No (10/01/19 0749), Progressively worsening motor and/or sensory loss: No (10/01/19 0749), Bowel and/or bladder incontinence post epidural: No (10/01/19 0749)  Riki Sheer

## 2019-10-01 NOTE — Progress Notes (Signed)
CSW acknowledges consult for patient being a teen mom. CSW is screening this referral out due to patient being over the age of 16 and there being no other psychosocial stressors listed in the chart. Please contact CSW upon [name] request if needed.    Donna [name] S. Donna [name], MSW, LCSW [facility] [phone]   

## 2019-10-01 NOTE — Lactation Note (Signed)
This note was copied from a baby's chart. Lactation Consultation Note  Patient Name: Donna [name] Today's Date: 10/01/2019 Reason for consult: Initial assessment;Difficult latch;Other (Comment);Term(DL on the left due to semi inverted/ per mom the baby recently breast fed) P2, 39 hour female infant. Per mom, infant finished breastfeeding infant  5 minutes prior to [facility] entering the room for 10 minutes.  Per mom, she felt infant nursed well using a 24 mm NS, LC did not assess latch at this time. Mom has breast shells in bra and LC reminded mom to wear breast shells during the day and not sleep in them at night. Mom has colostrum in breast shells, LC informed mom not to give to infant. Mom has not used DEBP yet, LC reviewed how to use and mom was pumping both breast on initial setting. LC notice mom was expressing breastmilk in bottle from DEBP. LC discussed with mom infant could be given EBM via: spoon, foley cup or curve tip syringe instead of using bottle nipple. Per mom, she knows how to use a curve tip syringe she has used it before with her first child. Mom knows to call LC to assess feeding for future latch. Mom will give infant pumped EBM after she latches infant at breast for extra volume.    Maternal Data Has patient been taught Hand Expression?: Yes Does the patient have breastfeeding experience prior to this delivery?: Yes  Feeding    LATCH Score                   Interventions Interventions: Breast feeding basics reviewed;Shells;DEBP  Lactation Tools Discussed/Used Pump Review: Setup, frequency, and cleaning Initiated by:: MAI Date initiated:: 10/01/19   Consult Status Consult Status: Follow-up Date: 10/01/19 Follow-up type: In-patient    Vicente Serene 10/01/2019, 8:19 PM

## 2019-10-01 NOTE — Lactation Note (Signed)
This note was copied from a baby's chart. Lactation Consultation Note  Patient Name: Boy [place] Nolasco[place]Gonzalez Today's Date: 10/01/2019 Reason for consult: Initial assessment;Difficult latch;Other (Comment);Term(DL on the left due to semi inverted/ per mom the baby recently breast fed)  Baby is 70 hours old  As LC entered the room baby asleep in dad's arms and per mom baby recently fed and  I asked for a Nipple Shield for my left breast due to semi inverted nipple.  LC resized mom with Nipple Shield and the #20 NS was to snug and LC fit mom in a #24 NS and it was a good fit. Per mom comfortable.  LC instructed mom on the use of shells between feedings except when sleeping. Also set mom up with the [place] and the #24 F is a good fit for both breast.  LC asked mom to call with feeding cues and use of the #24 NS.   Mom aware she will have to post pump after feedings due to the use of the NS .   Mom has the [facility] pamphlet with phone numbers.    Maternal Data Has patient been taught Hand Expression?: Yes Does the patient have breastfeeding experience prior to this delivery?: Yes  Feeding    LATCH Score                   Interventions Interventions: Breast feeding basics reviewed;Shells;DEBP  Lactation Tools Discussed/Used Pump Review: Setup, frequency, and cleaning Initiated by:: MAI Date initiated:: 10/01/19   Consult Status Consult Status: Follow-up Date: 10/01/19 Follow-up type: In-patient    [place] 10/01/2019, 7:40 PM

## 2019-10-01 NOTE — Progress Notes (Signed)
Donna [name] is a 18 y.o. G2P1001 at [redacted]w[redacted]d by ultrasound admitted for oligohydramnios.  Subjective: Feeling some pressure and "pain in vagina."  Objective: Vitals:   10/01/19 0030 10/01/19 0045 10/01/19 0050 10/01/19 0055  BP: 94/65     Pulse: 84     Resp:      Temp:    (P) 98.1 F (36.7 C)  TempSrc:    (P) Oral  SpO2:  100% 100%   Weight:      Height:        No intake/output data recorded. No intake/output data recorded.   FHT:  FHR: 135 bpm, variability: moderate,  accelerations:  Present,  decelerations:  Absent UC:   Difficult to trace SVE:   Dilation: 7 Effacement (%): 90 Station: -1 Exam by:: [employer]:   Recent Labs    09/30/19 1706  WBC 9.1  HGB 13.7  HCT 40.3  PLT 195    Assessment / Plan:  18 y.o. G2P1001 at [redacted]w[redacted]d by ultrasound admitted for oligohydramnios  Labor: Progressing normally - s/p FB and Cytotec. S/p AROM. Pit at 2. Continued difficulty tracing ctx. IUPC placed. Anticipate SVD. Preeclampsia:  no signs or symptoms of toxicity and labs stable Fetal Wellbeing:  Category I Pain Control:  Epidural  I/D:  GBS negative Anticipated MOD:  NSVD  Barrington Ellison, MD OB Family Medicine Fellow, [facility] for [employer], [place]

## 2019-10-01 NOTE — Plan of Care (Signed)
completed

## 2019-10-01 NOTE — Progress Notes (Signed)
Donna [name] is a 18 y.o. G2P1001 at [redacted]w[redacted]d by ultrasound admitted for oligohydramnios.  Subjective: Comfortable with epidural.  Objective: Vitals:   09/30/19 2201 09/30/19 2231 09/30/19 2301 09/30/19 2331  BP: 102/62 94/60 (!) 91/57 (!) 94/53  Pulse: 87 82 91 96  Resp:      Temp:      TempSrc:      SpO2:      Weight:      Height:        No intake/output data recorded. No intake/output data recorded.   FHT:  FHR: 135 bpm, variability: moderate,  accelerations:  Present,  decelerations:  Absent UC:   Difficult to trace SVE:   Dilation: 7 Effacement (%): 90 Station: -1 Exam by:: [employer]:   Recent Labs    09/30/19 1706  WBC 9.1  HGB 13.7  HCT 40.3  PLT 195    Assessment / Plan:  18 y.o. G2P1001 at [redacted]w[redacted]d by ultrasound admitted for oligohydramnios  Labor: Progressing normally - s/p FB and Cytotec. AROM with clear/bloody fluid this exam. Will start Pit low dose. Anticipate SVD. Preeclampsia:  no signs or symptoms of toxicity and labs stable Fetal Wellbeing:  Category I Pain Control:  Epidural  I/D:  GBS negative Anticipated MOD:  NSVD  Barrington Ellison, MD OB Family Medicine Fellow, [facility] for [employer], [place]

## 2019-10-02 ENCOUNTER — Other Ambulatory Visit (HOSPITAL_COMMUNITY): Payer: Self-pay

## 2019-10-02 DIAGNOSIS — Z975 Presence of (intrauterine) contraceptive device: Secondary | ICD-10-CM

## 2019-10-02 NOTE — Progress Notes (Addendum)
Post Partum Day 1  Subjective: Donna [name] is a 18 y.o. [id number], VD @ [redacted]w[redacted]d with oligo and BPP 6/8. Patient had an IUD insertion yesterday without any complications and has no complaints regarding the IUD today.  Patient reports having a bowel movement this morning without difficulty and is voiding as well. She is tolerating PO, ambulating, reports having minimal spotting w/o any clots and minimal pain over all.   Patient denies : HA, chest pain, pain in extremities, and edema  Objective: Blood pressure 98/64, pulse 89, temperature 98.2 F (36.8 C), temperature source Oral, resp. rate 18, height 5' (1.524 m), weight 79.8 kg, last menstrual period 12/21/2018, SpO2 99 %, unknown if currently breastfeeding.  Physical Exam:  General: alert, cooperative and no distress Lochia: appropriate Uterine Fundus: firm, below umbilicus DVT Evaluation: No evidence of DVT seen on physical exam.  Recent Labs    09/30/19 1706  HGB 13.7  HCT 40.3    Assessment/Plan: Plan for discharge tomorrow. Patient's baby currently in NICU due to episodes of bradycardia yesterday. Will consult with Peds regarding discharge plan.    LOS: 2 days   Hessie Dibble 10/02/2019, 9:33 AM   OB FELLOW POSTPARTUM PROGRESS NOTE ATTESTATION  I have seen and examined this patient and agree with above documentation in the student's note.   Phill Myron, D.O. OB Fellow  10/02/2019, 11:46 AM

## 2019-10-02 NOTE — Lactation Note (Signed)
This note was copied from a baby's chart. Lactation Consultation Note  Patient Name: Donna [name] Today's Date: 10/02/2019  Randel Books was transferred to the NICU.  Mom is currently not in her room.  She breastfed this morning for 15 minutes.  Pump was set up yesterday.  Providing Breastmilk For Your Baby in the NICU book left for Mom.  Will follow up later.   Maternal Data    Feeding Feeding Type: Breast Fed  LATCH Score Latch: Grasps breast easily, tongue down, lips flanged, rhythmical sucking.  Audible Swallowing: A few with stimulation  Type of Nipple: Everted at rest and after stimulation  Comfort (Breast/Nipple): Soft / non-tender  Hold (Positioning): Assistance needed to correctly position infant at breast and maintain latch.  LATCH Score: 8  Interventions    Lactation Tools Discussed/Used Tools: 52F feeding tube / Syringe   Consult Status      Ave Filter 10/02/2019, 11:59 AM

## 2019-10-02 NOTE — Lactation Note (Signed)
This note was copied from a baby's chart. Lactation Consultation Note  Patient Name: Donna [name] Today's Date: 10/02/2019 Reason for consult: Follow-up assessment;NICU baby;Term  LC in to assist with positioning and latching baby to the breast.  Baby 89 hrs old and starting to show feeding cues.  Mom just finished pumping both breasts and expressed 8 ml colostrum.   Assisted with positioning baby in football hold on left breast (the inverted one that is erect from pumping).  Tried without nipple shield but baby unable to latch deep enough.  Initiated 24 mm nipple shield and baby able to latch deeper.  Initiated 5 fr feeding tube while baby suckling.  Baby became more nutritive and took 8 ml well over 10 mins and remained on breast with swallows identified over another 10 mins.  Baby rooting again after burping.  Mom easily latched baby to right breast without a nipple shield.  Baby with deep jaw extensions and swallows identified.  Mom left with baby at her right breast.  Mom feeling uterine cramping during the feeding and asking for pain medication.   Mom to continue to pump after breast feeding 4-6 times a day to support a full milk supply.   To ask for help prn.  Feeding Feeding Type: Breast Fed  LATCH Score Latch: Grasps breast easily, tongue down, lips flanged, rhythmical sucking.  Audible Swallowing: Spontaneous and intermittent  Type of Nipple: Everted at rest and after stimulation  Comfort (Breast/Nipple): Soft / non-tender  Hold (Positioning): Assistance needed to correctly position infant at breast and maintain latch.  LATCH Score: 9  Interventions Interventions: Breast feeding basics reviewed;Assisted with latch;Skin to skin;Breast massage;Hand express;Breast compression;Adjust position;Support pillows;Position options;Expressed milk;Shells;DEBP  Lactation Tools Discussed/Used Tools: Shells;Pump;Nipple Shields;67F feeding tube / Syringe Nipple shield  size: 24 Shell Type: Inverted Breast pump type: Double-Electric Breast Pump   Consult Status Consult Status: Follow-up Date: 10/03/19 Follow-up type: In-patient    Broadus John 10/02/2019, 12:38 PM

## 2019-10-03 DIAGNOSIS — O4103X0 Oligohydramnios, third trimester, not applicable or unspecified: Secondary | ICD-10-CM

## 2019-10-03 DIAGNOSIS — O480 Post-term pregnancy: Secondary | ICD-10-CM

## 2019-10-03 DIAGNOSIS — O283 Abnormal ultrasonic finding on antenatal screening of mother: Secondary | ICD-10-CM

## 2019-10-03 MED ORDER — BENZOCAINE-MENTHOL 20-0.5 % EX AERO: 1.0000 "application " | INHALATION_SPRAY | CUTANEOUS | 0 refills | Status: AC | PRN

## 2019-10-03 MED ORDER — COCONUT OIL OIL: 1.0000 "application " | TOPICAL_OIL | 0 refills | Status: AC | PRN

## 2019-10-03 MED ORDER — IBUPROFEN 600 MG PO TABS: 600.0000 mg | ORAL_TABLET | Freq: Three times a day (TID) | ORAL | 0 refills | Status: AC | PRN

## 2019-10-03 MED ORDER — TETANUS-DIPHTH-ACELL PERTUSSIS 5-2.5-18.5 LF-MCG/0.5 IM SUSP: 0.5000 mL | Freq: Once | INTRAMUSCULAR | 0 refills | Status: AC

## 2019-10-03 MED ORDER — SENNOSIDES-DOCUSATE SODIUM 8.6-50 MG PO TABS: 2.0000 | ORAL_TABLET | ORAL | 0 refills | Status: AC

## 2019-10-03 NOTE — Lactation Note (Signed)
This note was copied from a baby's chart. Lactation Consultation Note:  P2 Mother that is breastfeeding and pumping.  Infant is 3 hours old and is maintaining weight .  Mother reports that infant last fed for 20 mins 40 mins ago.   Mother reports that she just pumped 25 ml.  She reports that she still feels full after infant feeds and after she pumps. She reports that she pumped for 30 mins trying to soften breast. She obtained 25 ml.   Advised mother to rouse infant and attempt to breastfeed from both breast to allow infant to drain the breast better.  Mother denies having and nipple tenderness and only a tiny bit of discomfort on the initial latch. Mother advised in hand expression, reverse pressure and pumping with hand pump piror to latching infant .  Mother is using a NS on one breast due to inversion. Mother reports that she sees Milk in the shield after breastfeeding. Mother very happy about this.  Mother has a Medela pump at home.  Suggested that she breastfeed infant more and began to pump less when milk comes to volume.   Discussed treatment and prevention of engorgement . Mother reports that she plans to use gel ice packs with the hole in them for engorgement. Discussed using ice over the entire breast to decrease swelling.   Mother was given a harmony hand pump with instructions.  Advised mother to continue to cue base feeding infant, informed mother that infant will continue to cluster feed for the next several nights.  Encouraged mother to do frequent STS. Mother reports that she has her mother living in to assist her. \ Mother is aware of available [place] services at [facility] and community support.   Patient Name: Donna [name] Today's Date: 10/03/2019 Reason for consult: Follow-up assessment   Maternal Data    Feeding Feeding Type: Breast Fed  LATCH Score                   Interventions Interventions: Breast massage;Hand express;Pre-pump if  needed;Reverse pressure;Hand pump;DEBP;Ice  Lactation Tools Discussed/Used     Consult Status Consult Status: Complete    Darla Lesches 10/03/2019, 9:01 AM

## 2019-10-03 NOTE — Discharge Instructions (Signed)

## 2019-10-04 ENCOUNTER — Inpatient Hospital Stay (HOSPITAL_COMMUNITY): Payer: Medicaid Other

## 2019-11-06 ENCOUNTER — Other Ambulatory Visit: Payer: Self-pay

## 2019-11-06 ENCOUNTER — Encounter: Payer: Self-pay | Admitting: Family Medicine

## 2019-11-06 ENCOUNTER — Ambulatory Visit (INDEPENDENT_AMBULATORY_CARE_PROVIDER_SITE_OTHER): Payer: Medicaid Other | Admitting: Family Medicine

## 2019-11-06 NOTE — Progress Notes (Signed)
    Subjective:  Donna [name] is a 18 y.o. female who presents to the [facility] today with a chief complaint of post partum follow up.   HPI:  Donna [name] is a G2P2002 5 weeks s/p SVD. Patient had uncomplicated delivery. IUD was placed after delivery. Strings were not cut. Patient reports some occasional vaginal discomfort and is concerned it is the strings. Patients bleeding has reduced significantly, just has occasional spotting. She is currently breast feeding. Says that her milk is well established and has not complaints or wish to follow up with lactation. Patient reports her mood is good. Negative [place] for depression or SI  ROS: Per HPI  PMH: Smoking history reviewed.    Objective:  Physical Exam: BP 96/60   Pulse 79   Ht 4\' 10"  (1.473 m)   Wt 162 lb 4 oz (73.6 kg)   LMP 12/21/2018 (Exact Date)   SpO2 98%   BMI 33.91 kg/m   Gen: NAD, resting comfortably CV: RRR with no murmurs appreciated Pulm: NWOB, CTAB with no crackles, wheezes, or rhonchi GI:  Soft, Nontender, Nondistended. GU: normal labia w/o lesion, normal vagina and normal cervix, visualized IUD strings under speculum exam. They were cut to approximately 1.5 cm long using sterile forcepts. Cervix was accidentally cut during this process. There was a small amount of bleeding at the site. Patient reported mild discomfort when this occurred.  MSK: no edema, cyanosis, or clubbing noted Skin: warm, dry Neuro: grossly normal, moves all extremities Psych: Normal affect and thought content  Chaperoned by Jazmin Harsell.   No results found for this or any previous visit (from the past 72 hour(s)).   Assessment/Plan:  Postpartum care and examination Patient presenting for routine postpartum check. Patient is going well since birth of her child. Unfortunately during IUD check and string shortening, patient cervix was accidentally cut with a small amount of bleeding and pain. I apologized to  patient. She was provided with a sanitary napkin. I recommended ibuprofen as needed for pain. Discussed return precautions including worsening vaginal bleeding, discharge, abdominal pain, fevers, chills. Counseled on return to sexual activity.    Lab Orders  No laboratory test(s) ordered today    No orders of the defined types were placed in this encounter.     Marny Lowenstein, MD, MS FAMILY MEDICINE RESIDENT - PGY3 11/06/2019 5:42 PM

## 2019-11-06 NOTE — Assessment & Plan Note (Signed)
Patient presenting for routine postpartum check. Patient is going well since birth of her child. Unfortunately during IUD check and string shortening, patient cervix was accidentally cut with a small amount of bleeding and pain. I apologized to patient. She was provided with a sanitary napkin. I recommended ibuprofen as needed for pain. Discussed return precautions including worsening vaginal bleeding, discharge, abdominal pain, fevers, chills. Counseled on return to sexual activity.

## 2019-11-06 NOTE — Patient Instructions (Signed)
It was a pleasure to see you today! Thank you for choosing [facility] for your primary care. Donna [name] was seen for Postpartum check.   We did an IUD check today. The cervix was accidentally injured during the string cutting. We are sorry for this. The strings were shortened. You may take tylenol or ibuprofen for pain. The bleeding should stop in a few days. If you have any continued or worsening pain, abdominal pain, worsening bleeding, fevers, chills, or any other worrisome symptoms, come back to the office or go to the emergency room.   Best,  Marny Lowenstein, MD, MS FAMILY MEDICINE RESIDENT - PGY3 11/06/2019 4:00 PM

## 2020-02-14 ENCOUNTER — Ambulatory Visit: Payer: Self-pay | Attending: Internal Medicine

## 2020-02-14 DIAGNOSIS — Z23 Encounter for immunization: Secondary | ICD-10-CM

## 2020-02-14 NOTE — Progress Notes (Signed)
   Covid-19 Vaccination Clinic  Name:  [place] Nolasco-Gonzalez    MRN: [id number] DOB: 2001-08-31  02/14/2020  Ms. Nolasco-Gonzalez was observed post Covid-19 immunization for 15 minutes without incident. She was provided with Vaccine Information Sheet and instruction to access the V-Safe system.   Ms. Wiers was instructed to call 911 with any severe reactions post vaccine: Marland Kitchen Difficulty breathing  . Swelling of face and throat  . A fast heartbeat  . A bad rash all over body  . Dizziness and weakness   Immunizations Administered    Name Date Dose VIS Date Route   Pfizer COVID-19 Vaccine 02/14/2020  2:16 PM 0.3 mL 10/25/2019 Intramuscular   Manufacturer: [employer], [employer]   Lot: [id number]   NDC: [address]-4

## 2020-03-10 ENCOUNTER — Ambulatory Visit: Payer: Self-pay | Attending: Internal Medicine

## 2020-03-10 DIAGNOSIS — Z23 Encounter for immunization: Secondary | ICD-10-CM

## 2020-03-10 NOTE — Progress Notes (Signed)
   Covid-19 Vaccination Clinic  Name:  Donna [name]    MRN: [id number] DOB: November 09, 2001  03/10/2020  Ms. [name] was observed post Covid-19 immunization for 15 minutes without incident. She was provided with Vaccine Information Sheet and instruction to access the V-Safe system.   Ms. Lenger was instructed to call 911 with any severe reactions post vaccine: Marland Kitchen Difficulty breathing  . Swelling of face and throat  . A fast heartbeat  . A bad rash all over body  . Dizziness and weakness   Immunizations Administered    Name Date Dose VIS Date Route   Pfizer COVID-19 Vaccine 03/10/2020  8:57 AM 0.3 mL 01/08/2019 Intramuscular   Manufacturer: [employer], [employer]   Lot: [id number]   NDC: [address]-2

## 2020-04-06 IMAGING — US US FETAL BPP W/ NON-STRESS
1 series · 13 of 15 positions shown · non-contrast
Comparison: none

[Series 1: us fetal bpp w/nonstress · 15 acquisitions, 13 frames shown]
[im 1/15]
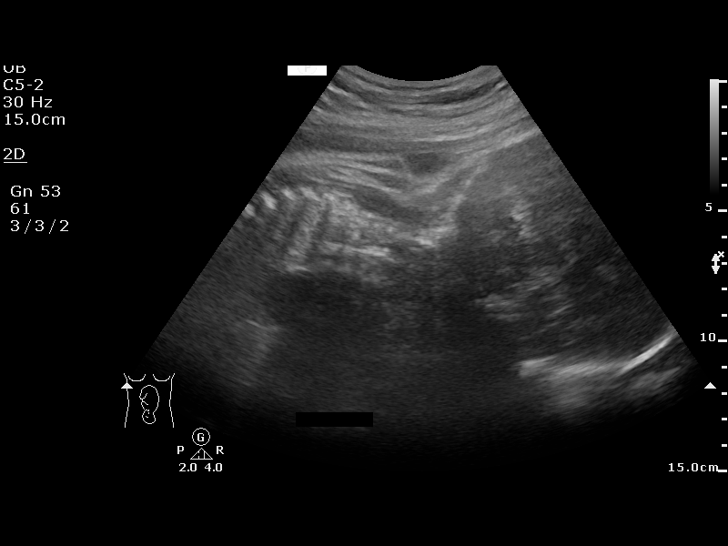
[im 2/15]
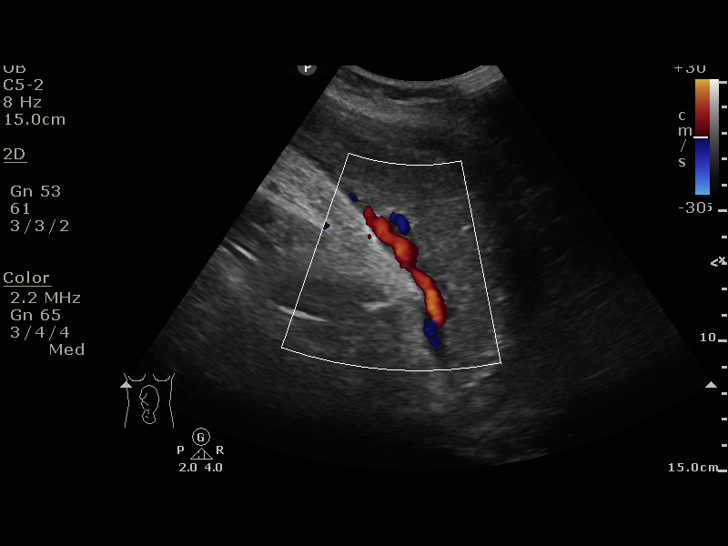
[im 3/15]
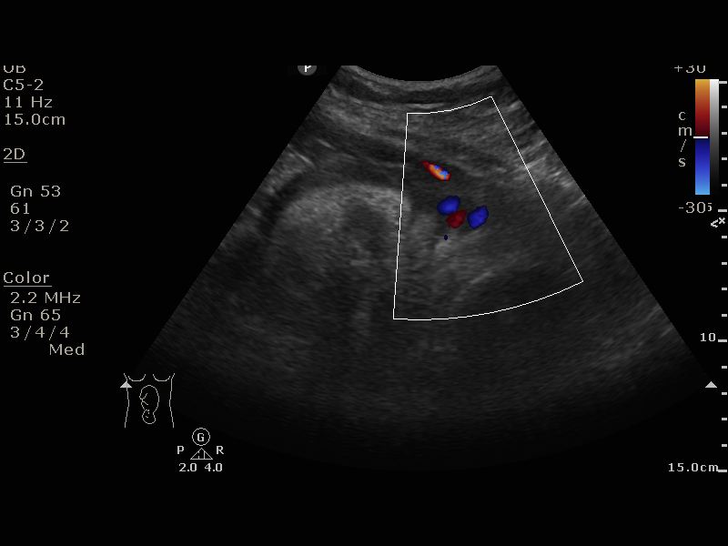
[im 5/15]
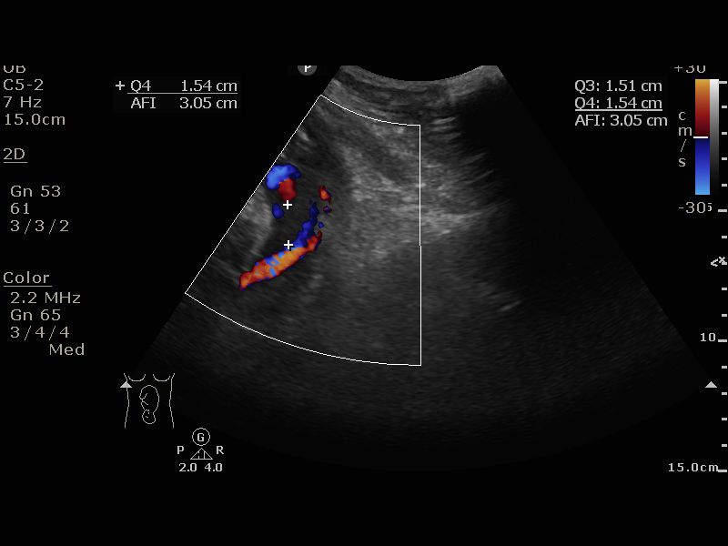
[im 6/15]
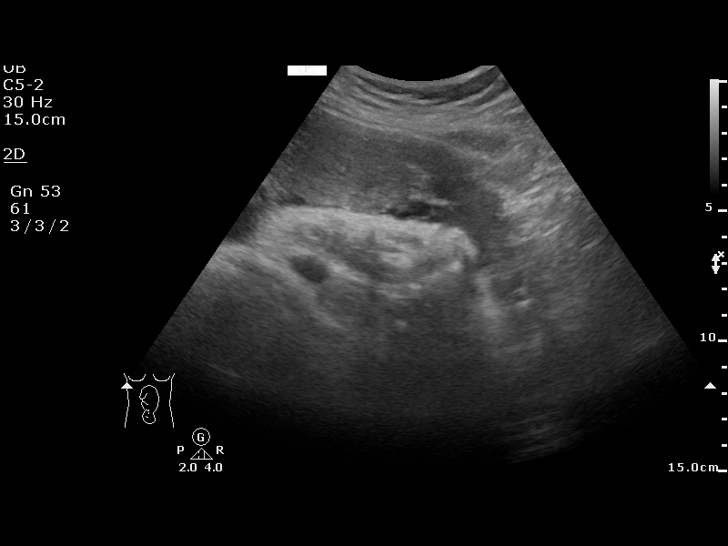
[im 7/15]
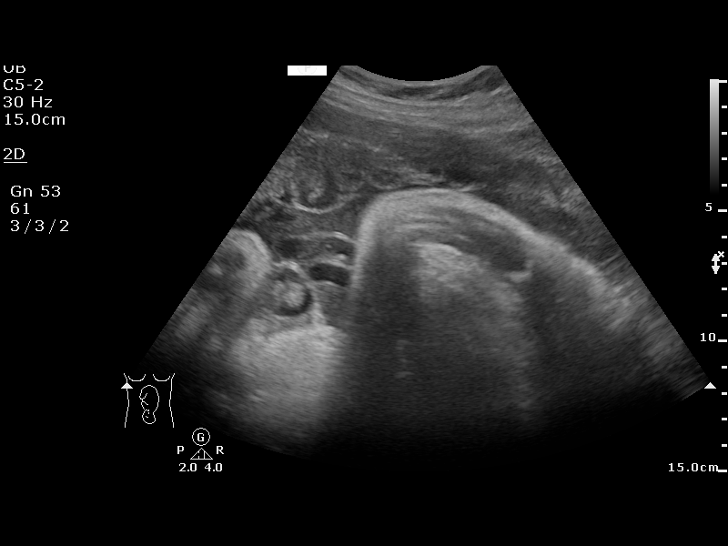
[im 8/15]
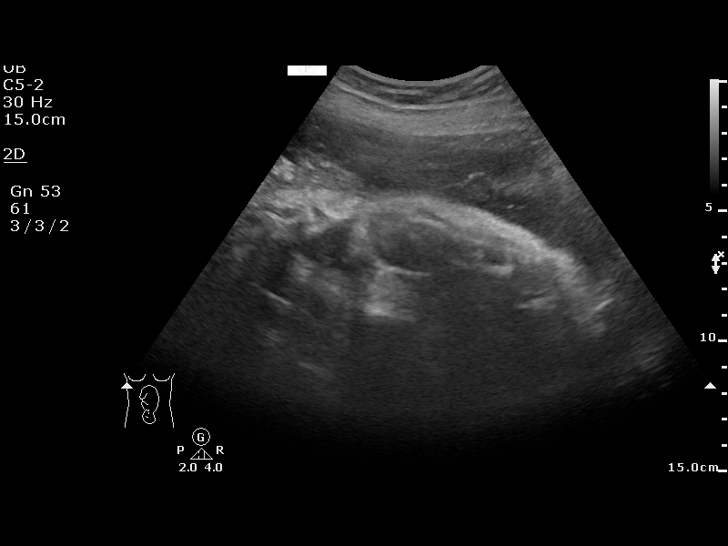
[im 9/15]
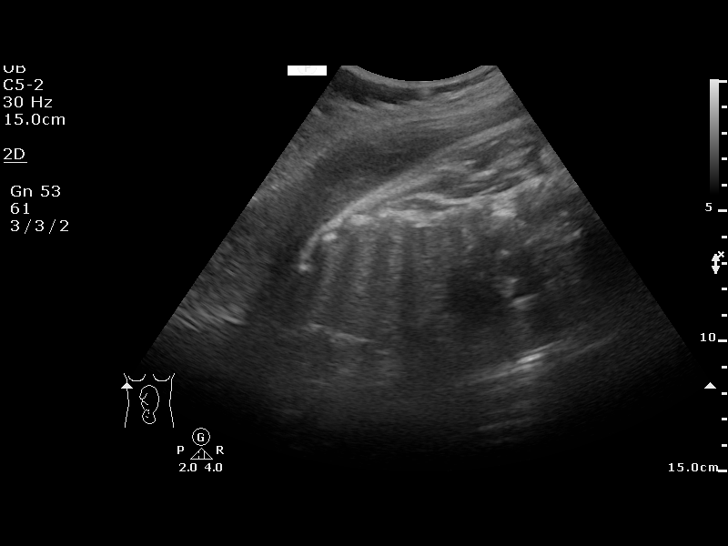
[im 10/15]
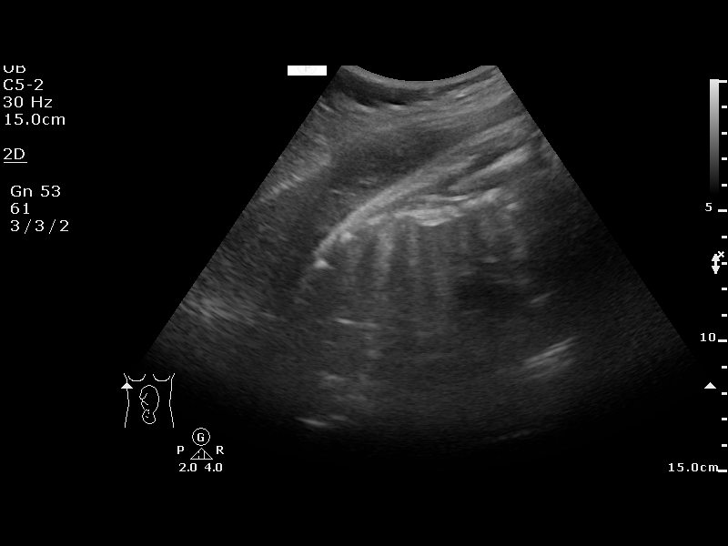
[im 11/15]
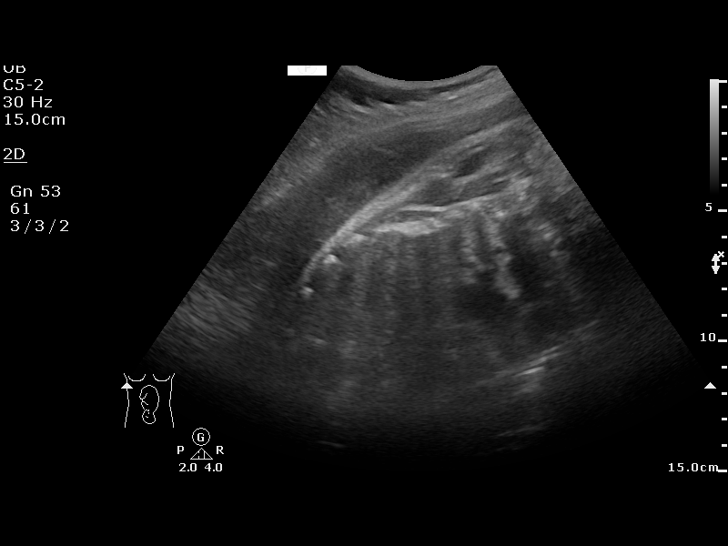
[im 13/15]
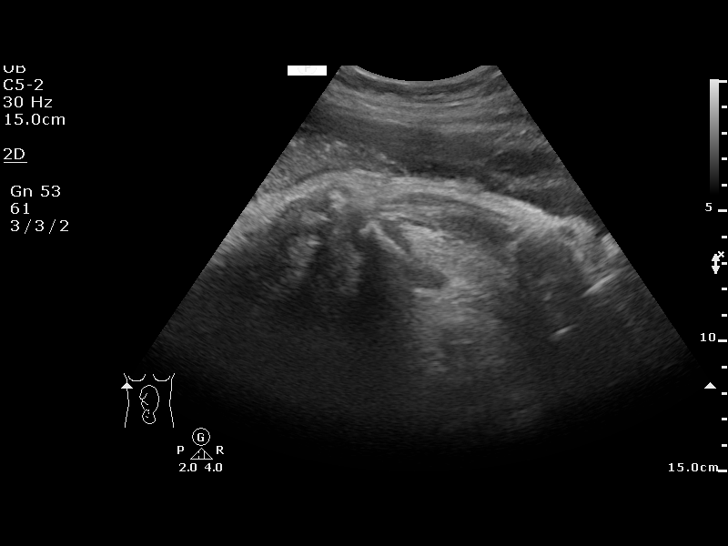
[im 14/15]
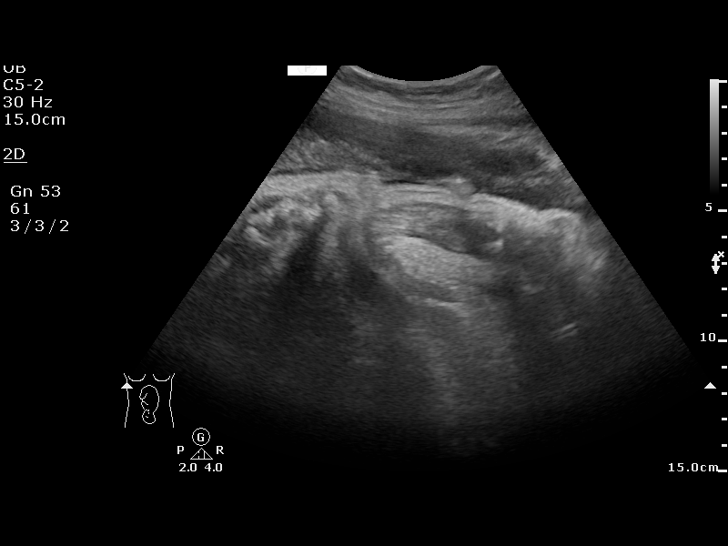
[im 15/15]
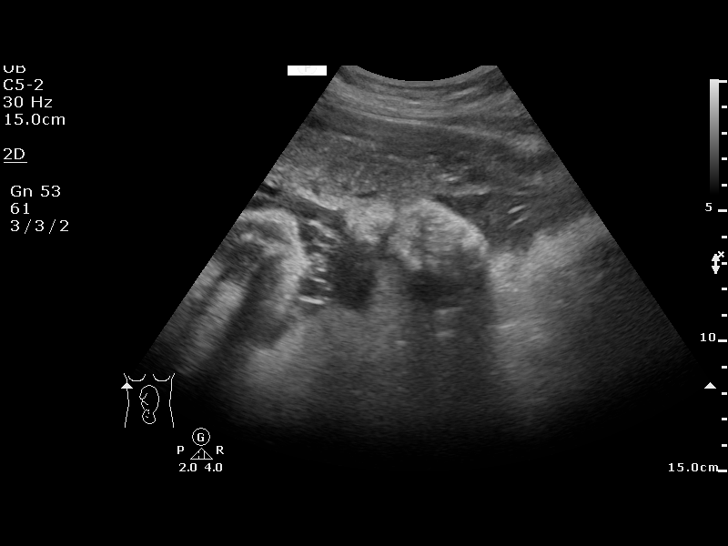

[13 of 15 positions shown; findings below may reference images not displayed]

[facility]

                                                             [address]
                                                             Women's
                                                             [REDACTED]

  1  US FETAL BPP W/NONSTRESS              76818.4     LATA DULIN
 ----------------------------------------------------------------------

 ----------------------------------------------------------------------
Service(s) Provided

 ----------------------------------------------------------------------
Indications

  Weeks of gestation of pregnancy not
  specified
  Postdate pregnancy (40-42 weeks)
 ----------------------------------------------------------------------
Fetal Evaluation

 Num Of Fetuses:          1
 Preg. Location:          Intrauterine
 Cardiac Activity:        Observed
 Presentation:            Cephalic

 Amniotic Fluid
 AFI FV:      Oligohydramnios

 AFI Sum(cm)                 Largest Pocket(cm)


 RUQ(cm)       RLQ(cm)       LUQ(cm)        LLQ(cm)
 0             1.54          0
Biophysical Evaluation

 Amniotic F.V:   Oligohydramnios            F. Tone:         Observed
 F. Movement:    Observed                   N.S.T:           Reactive
 F. Breathing:   Observed                   Score:           [DATE]
Impression
 BPP [DATE] oligohydramnios
Recommendations

 For delivery
                  Osorto, Tomihiro
